# Patient Record
Sex: Male | Born: 1970
Health system: Southern US, Community
[De-identification: ages and names within clinical notes are randomized; demographics above are authoritative.]

## PROBLEM LIST (undated history)

## (undated) DIAGNOSIS — R37 Sexual dysfunction, unspecified: Secondary | ICD-10-CM

## (undated) DIAGNOSIS — M1990 Unspecified osteoarthritis, unspecified site: Secondary | ICD-10-CM

## (undated) DIAGNOSIS — H93A1 Pulsatile tinnitus, right ear: Secondary | ICD-10-CM

## (undated) DIAGNOSIS — A749 Chlamydial infection, unspecified: Secondary | ICD-10-CM

## (undated) DIAGNOSIS — K219 Gastro-esophageal reflux disease without esophagitis: Secondary | ICD-10-CM

## (undated) DIAGNOSIS — M26629 Arthralgia of temporomandibular joint, unspecified side: Secondary | ICD-10-CM

## (undated) DIAGNOSIS — A599 Trichomoniasis, unspecified: Secondary | ICD-10-CM

## (undated) DIAGNOSIS — I10 Essential (primary) hypertension: Secondary | ICD-10-CM

## (undated) DIAGNOSIS — Z8719 Personal history of other diseases of the digestive system: Secondary | ICD-10-CM

## (undated) DIAGNOSIS — S8261XA Displaced fracture of lateral malleolus of right fibula, initial encounter for closed fracture: Secondary | ICD-10-CM

## (undated) DIAGNOSIS — Z8669 Personal history of other diseases of the nervous system and sense organs: Secondary | ICD-10-CM

## (undated) DIAGNOSIS — K828 Other specified diseases of gallbladder: Secondary | ICD-10-CM

## (undated) DIAGNOSIS — L259 Unspecified contact dermatitis, unspecified cause: Secondary | ICD-10-CM

## (undated) DIAGNOSIS — R109 Unspecified abdominal pain: Secondary | ICD-10-CM

## (undated) DIAGNOSIS — K644 Residual hemorrhoidal skin tags: Secondary | ICD-10-CM

## (undated) DIAGNOSIS — F329 Major depressive disorder, single episode, unspecified: Secondary | ICD-10-CM

## (undated) DIAGNOSIS — E559 Vitamin D deficiency, unspecified: Secondary | ICD-10-CM

## (undated) DIAGNOSIS — F29 Unspecified psychosis not due to a substance or known physiological condition: Secondary | ICD-10-CM

## (undated) DIAGNOSIS — E7800 Pure hypercholesterolemia, unspecified: Secondary | ICD-10-CM

## (undated) DIAGNOSIS — R51 Headache: Secondary | ICD-10-CM

## (undated) DIAGNOSIS — F419 Anxiety disorder, unspecified: Secondary | ICD-10-CM

## (undated) DIAGNOSIS — N529 Male erectile dysfunction, unspecified: Secondary | ICD-10-CM

## (undated) DIAGNOSIS — R319 Hematuria, unspecified: Secondary | ICD-10-CM

## (undated) HISTORY — DX: Other specified diseases of gallbladder: K82.8

## (undated) HISTORY — DX: Residual hemorrhoidal skin tags: K64.4

## (undated) HISTORY — DX: Sexual dysfunction, unspecified: R37

## (undated) HISTORY — DX: Vitamin D deficiency, unspecified: E55.9

## (undated) HISTORY — DX: Arthralgia of temporomandibular joint, unspecified side: M26.629

## (undated) HISTORY — DX: Essential (primary) hypertension: I10

## (undated) HISTORY — DX: Unspecified abdominal pain: R10.9

## (undated) HISTORY — DX: Unspecified psychosis not due to a substance or known physiological condition: F29

## (undated) HISTORY — DX: Pure hypercholesterolemia, unspecified: E78.00

## (undated) HISTORY — DX: Trichomoniasis, unspecified: A59.9

## (undated) HISTORY — DX: Male erectile dysfunction, unspecified: N52.9

## (undated) HISTORY — DX: Major depressive disorder, single episode, unspecified: F32.9

## (undated) HISTORY — DX: Hematuria, unspecified: R31.9

## (undated) HISTORY — DX: Anxiety disorder, unspecified: F41.9

## (undated) HISTORY — DX: Chlamydial infection, unspecified: A74.9

## (undated) HISTORY — DX: Personal history of other diseases of the digestive system: Z87.19

## (undated) HISTORY — DX: Unspecified contact dermatitis, unspecified cause: L25.9

## (undated) HISTORY — DX: Displaced fracture of lateral malleolus of right fibula, initial encounter for closed fracture: S82.61XA

---

## 1994-06-02 HISTORY — PX: FEMORAL ARTERY REPAIR: SHX1582

## 1994-06-02 HISTORY — PX: ORIF FEMUR FRACTURE: SHX2119

## 1999-08-08 ENCOUNTER — Emergency Department (HOSPITAL_COMMUNITY): Admission: EM | Admit: 1999-08-08 | Discharge: 1999-08-08 | Payer: Self-pay | Admitting: Emergency Medicine

## 1999-08-08 ENCOUNTER — Encounter: Payer: Self-pay | Admitting: Emergency Medicine

## 2005-07-24 ENCOUNTER — Emergency Department (HOSPITAL_COMMUNITY): Admission: EM | Admit: 2005-07-24 | Discharge: 2005-07-24 | Payer: Self-pay | Admitting: Emergency Medicine

## 2006-06-02 HISTORY — PX: INGUINAL HERNIA REPAIR: SHX194

## 2006-06-02 HISTORY — PX: HERNIA REPAIR: SHX51

## 2006-06-06 ENCOUNTER — Emergency Department (HOSPITAL_COMMUNITY): Admission: EM | Admit: 2006-06-06 | Discharge: 2006-06-06 | Payer: Self-pay | Admitting: Emergency Medicine

## 2006-09-10 ENCOUNTER — Ambulatory Visit (HOSPITAL_COMMUNITY): Admission: RE | Admit: 2006-09-10 | Discharge: 2006-09-10 | Payer: Self-pay | Admitting: Family Medicine

## 2006-09-10 ENCOUNTER — Ambulatory Visit: Payer: Self-pay | Admitting: Family Medicine

## 2006-09-10 ENCOUNTER — Encounter: Payer: Self-pay | Admitting: Family Medicine

## 2006-09-10 DIAGNOSIS — E669 Obesity, unspecified: Secondary | ICD-10-CM | POA: Insufficient documentation

## 2006-09-10 LAB — CONVERTED CEMR LAB
BUN: 14 mg/dL (ref 6–23)
CO2: 22 meq/L (ref 19–32)
Calcium: 9.7 mg/dL (ref 8.4–10.5)
Creatinine, Ser: 0.92 mg/dL (ref 0.40–1.50)
Glucose, Bld: 89 mg/dL (ref 70–99)
Ketones, urine, test strip: NEGATIVE
Urobilinogen, UA: 2
WBC Urine, dipstick: NEGATIVE

## 2006-09-11 ENCOUNTER — Encounter: Payer: Self-pay | Admitting: Family Medicine

## 2006-09-14 ENCOUNTER — Ambulatory Visit: Payer: Self-pay | Admitting: Sports Medicine

## 2006-09-16 ENCOUNTER — Ambulatory Visit: Payer: Self-pay | Admitting: Family Medicine

## 2006-10-15 ENCOUNTER — Ambulatory Visit: Payer: Self-pay | Admitting: Family Medicine

## 2006-11-12 ENCOUNTER — Encounter (INDEPENDENT_AMBULATORY_CARE_PROVIDER_SITE_OTHER): Payer: Self-pay | Admitting: *Deleted

## 2006-12-31 ENCOUNTER — Ambulatory Visit: Payer: Self-pay

## 2006-12-31 ENCOUNTER — Encounter: Payer: Self-pay | Admitting: *Deleted

## 2006-12-31 ENCOUNTER — Telehealth: Payer: Self-pay | Admitting: *Deleted

## 2007-01-01 ENCOUNTER — Encounter (INDEPENDENT_AMBULATORY_CARE_PROVIDER_SITE_OTHER): Payer: Self-pay | Admitting: Family Medicine

## 2007-01-01 LAB — CONVERTED CEMR LAB
CO2: 24 meq/L (ref 19–32)
Glucose, Bld: 76 mg/dL (ref 70–99)
Potassium: 4.4 meq/L (ref 3.5–5.3)
Sodium: 143 meq/L (ref 135–145)

## 2007-01-04 ENCOUNTER — Telehealth (INDEPENDENT_AMBULATORY_CARE_PROVIDER_SITE_OTHER): Payer: Self-pay | Admitting: Family Medicine

## 2007-01-05 ENCOUNTER — Ambulatory Visit (HOSPITAL_COMMUNITY): Admission: RE | Admit: 2007-01-05 | Discharge: 2007-01-05 | Payer: Self-pay | Admitting: Family Medicine

## 2007-01-07 ENCOUNTER — Ambulatory Visit: Payer: Self-pay | Admitting: Family Medicine

## 2007-01-13 ENCOUNTER — Encounter: Payer: Self-pay | Admitting: Family Medicine

## 2007-01-19 ENCOUNTER — Telehealth: Payer: Self-pay | Admitting: Family Medicine

## 2007-02-15 ENCOUNTER — Encounter: Admission: RE | Admit: 2007-02-15 | Discharge: 2007-02-15 | Payer: Self-pay | Admitting: General Surgery

## 2007-02-18 ENCOUNTER — Ambulatory Visit (HOSPITAL_BASED_OUTPATIENT_CLINIC_OR_DEPARTMENT_OTHER): Admission: RE | Admit: 2007-02-18 | Discharge: 2007-02-18 | Payer: Self-pay | Admitting: General Surgery

## 2007-02-25 ENCOUNTER — Ambulatory Visit: Payer: Self-pay | Admitting: Family Medicine

## 2007-09-23 ENCOUNTER — Ambulatory Visit: Payer: Self-pay | Admitting: Family Medicine

## 2007-09-23 DIAGNOSIS — M7120 Synovial cyst of popliteal space [Baker], unspecified knee: Secondary | ICD-10-CM | POA: Insufficient documentation

## 2007-09-24 ENCOUNTER — Encounter: Payer: Self-pay | Admitting: Family Medicine

## 2007-09-24 DIAGNOSIS — M216X9 Other acquired deformities of unspecified foot: Secondary | ICD-10-CM | POA: Insufficient documentation

## 2007-10-07 ENCOUNTER — Encounter: Payer: Self-pay | Admitting: Family Medicine

## 2007-10-21 ENCOUNTER — Encounter: Payer: Self-pay | Admitting: *Deleted

## 2007-12-30 ENCOUNTER — Ambulatory Visit (HOSPITAL_COMMUNITY): Admission: RE | Admit: 2007-12-30 | Discharge: 2007-12-30 | Payer: Self-pay | Admitting: Family Medicine

## 2007-12-30 ENCOUNTER — Ambulatory Visit: Payer: Self-pay | Admitting: Family Medicine

## 2007-12-30 DIAGNOSIS — M6250 Muscle wasting and atrophy, not elsewhere classified, unspecified site: Secondary | ICD-10-CM | POA: Insufficient documentation

## 2008-01-11 ENCOUNTER — Encounter: Payer: Self-pay | Admitting: Family Medicine

## 2008-01-18 ENCOUNTER — Encounter: Payer: Self-pay | Admitting: Family Medicine

## 2008-01-18 ENCOUNTER — Encounter: Admission: RE | Admit: 2008-01-18 | Discharge: 2008-04-17 | Payer: Self-pay | Admitting: Family Medicine

## 2008-01-20 ENCOUNTER — Ambulatory Visit: Payer: Self-pay | Admitting: Family Medicine

## 2008-02-09 ENCOUNTER — Telehealth: Payer: Self-pay | Admitting: Psychology

## 2008-02-17 ENCOUNTER — Ambulatory Visit: Payer: Self-pay | Admitting: Family Medicine

## 2008-02-17 DIAGNOSIS — F4310 Post-traumatic stress disorder, unspecified: Secondary | ICD-10-CM | POA: Insufficient documentation

## 2008-02-23 ENCOUNTER — Encounter: Payer: Self-pay | Admitting: Family Medicine

## 2008-02-24 ENCOUNTER — Ambulatory Visit: Payer: Self-pay | Admitting: Family Medicine

## 2008-03-13 ENCOUNTER — Ambulatory Visit: Payer: Self-pay | Admitting: Family Medicine

## 2008-03-16 ENCOUNTER — Ambulatory Visit: Payer: Self-pay | Admitting: Family Medicine

## 2008-03-20 ENCOUNTER — Ambulatory Visit: Payer: Self-pay | Admitting: Family Medicine

## 2008-04-06 ENCOUNTER — Encounter: Payer: Self-pay | Admitting: *Deleted

## 2008-06-07 ENCOUNTER — Encounter: Payer: Self-pay | Admitting: Family Medicine

## 2008-07-10 ENCOUNTER — Ambulatory Visit: Payer: Self-pay | Admitting: Family Medicine

## 2008-07-24 ENCOUNTER — Ambulatory Visit: Payer: Self-pay | Admitting: Family Medicine

## 2008-07-31 ENCOUNTER — Ambulatory Visit: Payer: Self-pay | Admitting: Family Medicine

## 2008-07-31 LAB — CONVERTED CEMR LAB
BUN: 15 mg/dL (ref 6–23)
CO2: 21 meq/L (ref 19–32)
Chloride: 107 meq/L (ref 96–112)
Creatinine, Ser: 0.82 mg/dL (ref 0.40–1.50)
HDL: 32 mg/dL — ABNORMAL LOW (ref >39)
LDL Cholesterol: 134 mg/dL — ABNORMAL HIGH (ref 0–99)

## 2008-08-02 DIAGNOSIS — F17200 Nicotine dependence, unspecified, uncomplicated: Secondary | ICD-10-CM

## 2008-08-02 DIAGNOSIS — M25569 Pain in unspecified knee: Secondary | ICD-10-CM | POA: Insufficient documentation

## 2008-08-02 DIAGNOSIS — M7061 Trochanteric bursitis, right hip: Secondary | ICD-10-CM | POA: Insufficient documentation

## 2008-08-02 DIAGNOSIS — Z720 Tobacco use: Secondary | ICD-10-CM | POA: Insufficient documentation

## 2008-08-14 ENCOUNTER — Telehealth: Payer: Self-pay | Admitting: Psychology

## 2008-08-28 ENCOUNTER — Ambulatory Visit: Payer: Self-pay | Admitting: Family Medicine

## 2008-09-18 ENCOUNTER — Ambulatory Visit: Payer: Self-pay | Admitting: Psychology

## 2008-09-21 ENCOUNTER — Ambulatory Visit: Payer: Self-pay | Admitting: Family Medicine

## 2008-09-27 ENCOUNTER — Telehealth: Payer: Self-pay | Admitting: Family Medicine

## 2008-10-05 ENCOUNTER — Ambulatory Visit: Payer: Self-pay | Admitting: Family Medicine

## 2008-10-05 DIAGNOSIS — G5790 Unspecified mononeuropathy of unspecified lower limb: Secondary | ICD-10-CM | POA: Insufficient documentation

## 2008-10-11 ENCOUNTER — Telehealth: Payer: Self-pay | Admitting: Psychology

## 2008-10-26 ENCOUNTER — Ambulatory Visit: Payer: Self-pay | Admitting: Psychology

## 2008-10-31 ENCOUNTER — Telehealth: Payer: Self-pay | Admitting: Family Medicine

## 2008-11-02 ENCOUNTER — Ambulatory Visit: Payer: Self-pay | Admitting: Family Medicine

## 2008-11-23 ENCOUNTER — Telehealth: Payer: Self-pay | Admitting: Psychology

## 2008-12-07 ENCOUNTER — Ambulatory Visit: Payer: Self-pay | Admitting: Psychology

## 2008-12-20 ENCOUNTER — Encounter: Payer: Self-pay | Admitting: Psychology

## 2008-12-20 ENCOUNTER — Telehealth: Payer: Self-pay | Admitting: Psychology

## 2008-12-20 ENCOUNTER — Encounter: Payer: Self-pay | Admitting: Family Medicine

## 2008-12-25 ENCOUNTER — Ambulatory Visit: Payer: Self-pay | Admitting: Family Medicine

## 2009-01-19 ENCOUNTER — Encounter: Payer: Self-pay | Admitting: Family Medicine

## 2009-01-23 ENCOUNTER — Telehealth: Payer: Self-pay | Admitting: Family Medicine

## 2009-01-25 ENCOUNTER — Ambulatory Visit: Payer: Self-pay | Admitting: Family Medicine

## 2009-01-29 ENCOUNTER — Telehealth: Payer: Self-pay | Admitting: Psychology

## 2009-02-15 ENCOUNTER — Ambulatory Visit: Payer: Self-pay | Admitting: Family Medicine

## 2009-02-15 ENCOUNTER — Telehealth: Payer: Self-pay | Admitting: Family Medicine

## 2009-03-06 ENCOUNTER — Encounter: Payer: Self-pay | Admitting: Family Medicine

## 2009-03-08 ENCOUNTER — Ambulatory Visit: Payer: Self-pay | Admitting: Family Medicine

## 2009-03-08 DIAGNOSIS — G44209 Tension-type headache, unspecified, not intractable: Secondary | ICD-10-CM

## 2009-03-08 DIAGNOSIS — G43009 Migraine without aura, not intractable, without status migrainosus: Secondary | ICD-10-CM

## 2009-03-08 HISTORY — DX: Migraine without aura, not intractable, without status migrainosus: G43.009

## 2009-03-08 HISTORY — DX: Tension-type headache, unspecified, not intractable: G44.209

## 2009-03-09 ENCOUNTER — Encounter: Payer: Self-pay | Admitting: *Deleted

## 2009-03-12 ENCOUNTER — Encounter: Admission: RE | Admit: 2009-03-12 | Discharge: 2009-03-12 | Payer: Self-pay | Admitting: Family Medicine

## 2009-04-23 ENCOUNTER — Ambulatory Visit: Payer: Self-pay | Admitting: Family Medicine

## 2009-06-07 ENCOUNTER — Ambulatory Visit: Payer: Self-pay | Admitting: Family Medicine

## 2009-08-09 ENCOUNTER — Ambulatory Visit: Payer: Self-pay | Admitting: Family Medicine

## 2009-08-09 DIAGNOSIS — K644 Residual hemorrhoidal skin tags: Secondary | ICD-10-CM | POA: Insufficient documentation

## 2009-08-09 DIAGNOSIS — Z8719 Personal history of other diseases of the digestive system: Secondary | ICD-10-CM | POA: Insufficient documentation

## 2009-08-09 HISTORY — DX: Residual hemorrhoidal skin tags: K64.4

## 2009-08-09 HISTORY — DX: Personal history of other diseases of the digestive system: Z87.19

## 2009-11-08 ENCOUNTER — Ambulatory Visit: Payer: Self-pay | Admitting: Family Medicine

## 2009-11-08 DIAGNOSIS — M545 Low back pain, unspecified: Secondary | ICD-10-CM | POA: Insufficient documentation

## 2010-04-17 ENCOUNTER — Telehealth: Payer: Self-pay | Admitting: Family Medicine

## 2010-06-17 ENCOUNTER — Ambulatory Visit
Admission: RE | Admit: 2010-06-17 | Discharge: 2010-06-17 | Payer: Self-pay | Source: Home / Self Care | Attending: Family Medicine | Admitting: Family Medicine

## 2010-06-18 DIAGNOSIS — F29 Unspecified psychosis not due to a substance or known physiological condition: Secondary | ICD-10-CM | POA: Insufficient documentation

## 2010-06-18 HISTORY — DX: Unspecified psychosis not due to a substance or known physiological condition: F29

## 2010-06-23 ENCOUNTER — Encounter: Payer: Self-pay | Admitting: Family Medicine

## 2010-07-04 ENCOUNTER — Ambulatory Visit: Payer: Self-pay | Admitting: Family Medicine

## 2010-07-04 ENCOUNTER — Encounter: Payer: Self-pay | Admitting: Family Medicine

## 2010-07-04 ENCOUNTER — Ambulatory Visit: Admit: 2010-07-04 | Payer: Self-pay

## 2010-07-04 NOTE — Assessment & Plan Note (Signed)
Summary: F/U VISIT/[facility]   Vital Signs:  Patient profile:   40 year old male Height:      71.5 inches Weight:      208.25 pounds BMI:     28.74 BSA:     2.16 Temp:     98.1 degrees F Pulse rate:   80 / minute BP sitting:   128 / 78  Vitals Entered By: Jone Baseman CMA (June 17, 2010 2:52 PM) CC: Smoking cessation Is Patient Diabetic? No Pain Assessment Patient in pain? no        Primary Care Provider:  Treyveon Mochizuki MD  CC:  Smoking cessation.  History of Present Illness: Smoking Derrick Ortiz is interested in quitting smoking Smoked for last 5 to 6  years. Smokes 3/4 of a pack daily of Newports (Regular) Smokes first cigarette in morning within 10 minutes of awakening. Has not tried to quit in past. Wants to quit because of his children and b/c he cannot smoke at [employer] where he started classes this semester. he does not dip or chew tobacco.  He is now being followed at the [facility] for his Psychosis (NOS)  He has run out of his migraine medications including Naprosyn Imitrex and Inderal LA.  He has notice an increase in his headaches since running out,  he wants to refill and restart these medications.     Habits & Providers  Alcohol-Tobacco-Diet     Alcohol drinks/day: 0     Tobacco Status: current     Tobacco Counseling: to quit use of tobacco products     Cigarette Packs/Day: <0.25     Year Started: 2005     Year Quit: 3 months ago  Current Medications (verified): 1)  Naproxen Dr 500 Mg  Tbec (Naproxen) .Marland Kitchen.. 1 Tablet Twice A Day If Needed For Leg Pain 2)  Kls Omeprazole 20 Mg  Tbec (Omeprazole) .... One Tablet Each Night For Indigestion and Stomach Protection 3)  Seroquel 200 Mg Tabs (Quetiapine Fumarate) .... 2 Tablets in Morning and Two Tablets At Night 4)  Imitrex 100 Mg Tabs (Sumatriptan Succinate) .... One Tablet By Mouth For Severe Headache, May Repeat in Two Hours If Head Ache Persists 5)  Trazodone Hcl 100 Mg Tabs (Trazodone Hcl) .Marland Kitchen.. 1 Tablet  At Bedtime Daily 6)  Inderal La 80 Mg Xr24h-Cap (Propranolol Hcl) .... One Capsule Daily For Prevention of Migraine Headaches 7)  Anusol-Hc 25 Mg Supp (Hydrocortisone Acetate) .... Insert 1 Suppository Into Rectum Three Times A Day Until All Suppositories Are Gone 8)  Nicorette 2 Mg Gum (Nicotine Polacrilex) .... Chew 1 Piece Slowly and Intermittently Overy 30 Minutes Every 1 To 2 Hours For 6 Weeks Then Every 2-4 Hours For 3 Weeks 9)  Abilify 10 Mg Tabs (Aripiprazole) .... One Tablet By Mouth Daily  Allergies (verified): No Known Drug Allergies  Past History:  Past medical, surgical, family and social histories (including risk factors) reviewed for relevance to current acute and chronic problems.  Past Medical History: Reviewed history from 07/31/2008 and no changes required. 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 S/P Femoral-popliteal artery repair (Dr. Nedra Hai)  after traumatic injury in 1996 ORIFfor traumatic  left leg fracture around knee  in 1996 Umbilical Herniorrhaphy [facility] 09/08)  Family History: Reviewed history from 03/13/2008 and no changes required. Family History Diabetes 1st degree relative (Father) Family History Hypertension (  Father) Family History Psychiatric care: Father and paternal grandfather-uncertain of diagnosis or treatments.    Social History: Reviewed history from 08/09/2009 and no changes required. Married to [employer]  in 2008. Patient is functionally Illiterate  Completed 9th grade.  Was in Learning Disorder classes through Middle school.  he has great difficulty with most reading meterials.  (+) Medicaid starting in Fall of 2010  Disabled with SSI income starting 07/2009. Patient lives in his mother's home and visit's his wife's home frequently.  Children: One son  (b. 06/2007) and two step-daughters, Bevelyn Ngo (DOB 08-21-99) and Iran Sizer (07/19/04)-former premie in home. Kavon has a 43 year-old son who does not live in the home but visits often. [facility] clinic patient. contact:Brenda Earl Lites [phone] or [phone]) [facility] Education: >=12 years Sometimes has difficulty understanding written medical information Religian: Christian Current Smoker: 1/5 ppd for last 2 years.  Alcohol use-no Believes he eats a healthy diet Exercise:weights lift. Occupation: Disabled secondary to Psychiatric conditions and left foot palsy.    Impression & Recommendations:  Problem # 1:  CIGARETTE SMOKER (ICD-305.1) Assessment Comment Only  After discussion of nicotene replacement options, [name] chose the use of nicotene gum replacement.   He was given the [facility] QUIT NOW telephone number and sked to contact them for coaching him during his quit attempt. He plans to set his quit date for this week.  RTC in 2 weeks to assess success and tolerance to medication.  His updated medication list for this problem includes:    Nicorette 2 Mg Gum (Nicotine polacrilex) .Marland Kitchen... Chew 1 piece slowly and intermittently overy 30 minutes every 1 to 2 hours for 6 weeks then every 2-4 hours for 3 weeks  Orders: [facility]- Est Level  3 (19147)  Complete Medication List: 1)  Naproxen Dr 500 Mg Tbec (Naproxen) .Marland Kitchen.. 1 tablet twice a day if needed for leg pain 2)  Kls Omeprazole 20 Mg Tbec (Omeprazole) .... One tablet each night for indigestion and stomach protection 3)  Seroquel 200 Mg Tabs (Quetiapine fumarate) .... 2 tablets in morning and two tablets at night 4)  Imitrex 100 Mg Tabs (Sumatriptan succinate) .... One tablet by mouth for severe headache, may repeat in two hours if head ache persists 5)  Trazodone Hcl 100 Mg Tabs (Trazodone hcl) .Marland Kitchen.. 1 tablet at bedtime daily 6)  Inderal La 80 Mg Xr24h-cap (Propranolol hcl) .... One capsule daily for prevention  of migraine headaches 7)  Anusol-hc 25 Mg Supp (Hydrocortisone acetate) .... Insert 1 suppository into rectum three times a day until all suppositories are gone 8)  Nicorette 2 Mg Gum (Nicotine polacrilex) .... Chew 1 piece slowly and intermittently overy 30 minutes every 1 to 2 hours for 6 weeks then every 2-4 hours for 3 weeks 9)  Abilify 10 Mg Tabs (Aripiprazole) .... One tablet by mouth daily  Patient Instructions: 1)  Please schedule a follow-up appointment in 2 weeks-may double book.  2)  Call [facility] QUIT-NOW ([phone]) to get helpful counseling on quiting smoking.  3)  Chew the Nicorette gun until it starts to burn, then tuck the gum down into the space between your ckeck and gum to absorb the nicotene.  Chew on the gum to reactivate it when you have another cigareete craving, then tuck the gum back into your gum again. 4)  Once the gum stops burning, replace it with a fresh piece of gum.   Prescriptions: INDERAL LA 80 MG XR24H-CAP (PROPRANOLOL HCL) One  capsule daily for prevention of migraine headaches  #34 x PRN   Entered and Authorized by:   Tawanna Cooler Wiktoria Hemrick MD   Signed by:   Tawanna Cooler Aubryanna Nesheim MD on 06/17/2010   Method used:   Electronically to        [id number]* (retail)       [address]       [place], [place]  [address]       Ph: [phone]       Fax: [phone]   RxID:   [id number] IMITREX 100 MG TABS (SUMATRIPTAN SUCCINATE) One tablet by mouth for severe headache, may repeat in two hours if head ache persists  #12 x PRN   Entered and Authorized by:   Tawanna Cooler Abrey Bradway MD   Signed by:   Tawanna Cooler Cochise Dinneen MD on 06/17/2010   Method used:   Electronically to        [facility] [phone]* (retail)       [address]       [place], [place]  [address]       Ph: [phone]       Fax: [phone]   RxID:   [id number] KLS OMEPRAZOLE 20 MG  TBEC (OMEPRAZOLE) One tablet each night for indigestion and stomach protection  #30 x 5   Entered and  Authorized by:   Tawanna Cooler Eulonda Andalon MD   Signed by:   Tawanna Cooler Ezekeil Bethel MD on 06/17/2010   Method used:   Electronically to        [id number]* (retail)       [address]       [place], [place]  [address]       Ph: [phone]       Fax: [phone]   RxID:   [id number] NAPROXEN DR 500 MG  TBEC (NAPROXEN) 1 tablet twice a day if needed for leg pain  #60 x PRN   Entered and Authorized by:   Tawanna Cooler Kenai Fluegel MD   Signed by:   Tawanna Cooler Alixis Harmon MD on 06/17/2010   Method used:   Electronically to        [facility] [phone]* (retail)       [address]       [place], [place]  [address]       Ph: [phone]       Fax: [phone]   RxID:   [id number] NICORETTE 2 MG GUM (NICOTINE POLACRILEX) Chew 1 piece slowly and intermittently overy 30 minutes every 1 to 2 hours for 6 weeks then every 2-4 hours for 3 weeks  #108 x 2   Entered and Authorized by:   Tawanna Cooler Virda Betters MD   Signed by:   Tawanna Cooler Shirelle Tootle MD on 06/17/2010   Method used:   Electronically to        [facility] [phone]* (retail)       [address]       [place], [place]  [address]       Ph: [phone]       Fax: [phone]   RxID:   [id number]    Orders Added: 1)  [facility]- Est Level  3 [90240]

## 2010-07-04 NOTE — Assessment & Plan Note (Signed)
Summary: back pain/eo   Vital Signs:  Patient profile:   40 year old male Height:      71.5 inches Weight:      203 pounds BMI:     28.02 BSA:     2.14 Temp:     97.6 degrees F Pulse rate:   75 / minute BP sitting:   118 / 81  Vitals Entered By: Jone Baseman CMA (November 08, 2009 9:19 AM) CC: f/u Is Patient Diabetic? No Pain Assessment Patient in pain? yes     Location: back Intensity: 8   Primary Care Provider:  Acquanetta Belling MD  CC:  f/u.  History of Present Illness: BACK PAIN Location: bilateral low back Onset: 5-6 months ago Description: aching, tight pain that has been associated with his "back locking up on me" three times in last 6 months.  Better with:  Pulling knees to chest. Having someone walk on his low back. lying down or standing.  Naprosyn and Ibuprofen not helping  Worse with: sitting or wearing left leg prosthesis Trauma: none recalled  Red Flags Fecal/urinary incontinence: none Weakness: none Fever/chills: none Night pain: none Unexplained weight loss: none No relief with bedrest: no Cancer/immunosuppression: no IV drug use: no PMH of osteoporosis or chronic steroid use: no     Habits & Providers  Alcohol-Tobacco-Diet     Alcohol drinks/day: 0     Tobacco Status: current     Tobacco Counseling: to quit use of tobacco products     Cigarette Packs/Day: <0.25  Current Medications (verified): 1)  Naproxen Dr 500 Mg  Tbec (Naproxen) .Marland Kitchen.. 1 Tablet Twice A Day If Needed For Leg Pain 2)  Kls Omeprazole 20 Mg  Tbec (Omeprazole) .... Two Tablets Each Night For Indigestion and Stomach Protection 3)  Seroquel 200 Mg Tabs (Quetiapine Fumarate) .... 2 Tablets in Morning and Two Tablets At Night 4)  Imitrex 100 Mg Tabs (Sumatriptan Succinate) .... One Tablet By Mouth For Severe Headache, May Repeat in Two Hours If Head Ache Persists 5)  Trazodone Hcl 100 Mg Tabs (Trazodone Hcl) .Marland Kitchen.. 1 Tablet At Bedtime Daily 6)  Inderal La 80 Mg Xr24h-Cap  (Propranolol Hcl) .... One Capsule Daily For Prevention of Migraine Headaches 7)  Anusol-Hc 25 Mg Supp (Hydrocortisone Acetate) .... Insert 1 Suppository Into Rectum Three Times A Day Until All Suppositories Are Gone  Allergies (verified): No Known Drug Allergies  Past History:  Past medical history reviewed for relevance to current acute and chronic problems. Past surgical history reviewed for relevance to current acute and chronic problems.  Past Medical History: Reviewed history from 07/31/2008 and no changes required. 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 S/P Femoral-popliteal artery repair (Dr. Nedra Hai)  after traumatic injury in 1996 ORIFfor traumatic  left leg fracture around knee  in 1996 Umbilical Herniorrhaphy [facility] 09/08)  Physical Exam  General:  alert, well-developed, and well-nourished.   Neurologic:  strength normal in all extremities and DTRs symmetrical and normal.   Psych:  normally interactive, good eye contact, not anxious appearing, and not depressed appearing.     Detailed Back/Spine Exam  General:    No significant lumbar  lordosis  Gait:    non-antalgic.    Skin:    no erythema  Inspection:    no deformity or swelling  Lumbosacral Exam:  Inspection-deformity:    Normal  Palpation-spinal tenderness:  Normal Range of Motion:    Forward Flexion:   25 degrees    Hyperextension:   15 degrees    Right Lateral Bend:   25 degrees    Left Lateral Bend:   25 degrees Lying Straight Leg Raise:    Right:  negative    Left:  negative Sitting Straight Leg Raise:    Right:  negative    Left:  negative Contralateral Straight Leg Raise:    Right:  negative    Left:  negative Sciatic Notch:    There is no sciatic notch tenderness. Toe Walking:    Right:  normal    Left:   normal Heel Walking:    Right:  normal    Left:  normal Patrick's Maneuver:    Right:  negative    Left:  negative Fabere Test:    Right:  negative    Left:  negative   Impression & Recommendations:  Problem # 1:  LOW BACK PAIN, CHRONIC (ICD-724.2) Assessment New Non-specific back pain.  No findings concerning for serious spinal pathology.  Given chronicity, will check Lumbar spine  Referral for Physical Therapy for evaluation and treatment Pt Ed material on back care and back exercises given.  His updated medication list for this problem includes:    Naproxen Dr 500 Mg Tbec (Naproxen) .Marland Kitchen... 1 tablet twice a day if needed for leg pain  Orders: Radiology other (Radiology Other) Physical Therapy Referral (PT) [facility]- Est Level  3 (93235)  Complete Medication List: 1)  Naproxen Dr 500 Mg Tbec (Naproxen) .Marland Kitchen.. 1 tablet twice a day if needed for leg pain 2)  Kls Omeprazole 20 Mg Tbec (Omeprazole) .... Two tablets each night for indigestion and stomach protection 3)  Seroquel 200 Mg Tabs (Quetiapine fumarate) .... 2 tablets in morning and two tablets at night 4)  Imitrex 100 Mg Tabs (Sumatriptan succinate) .... One tablet by mouth for severe headache, may repeat in two hours if head ache persists 5)  Trazodone Hcl 100 Mg Tabs (Trazodone hcl) .Marland Kitchen.. 1 tablet at bedtime daily 6)  Inderal La 80 Mg Xr24h-cap (Propranolol hcl) .... One capsule daily for prevention of migraine headaches 7)  Anusol-hc 25 Mg Supp (Hydrocortisone acetate) .... Insert 1 suppository into rectum three times a day until all suppositories are gone

## 2010-07-04 NOTE — Progress Notes (Signed)
  Phone Note Call from Patient   Caller: Patient Call For: [phone] Summary of Call: Patient calling regarding meds IMETRIX not working for headaches.  Need another type of meds called to pharmacy.  [facility] [address] Initial call taken by: Abundio Miu,  April 17, 2010 3:56 PM  Follow-up for Phone Call        Patient may use OTC aleve or tylenol for headaches.  He should make an appointment to see me to discuss his headaches.  Follow-up by: Tawanna Cooler McDiarmid MD,  April 22, 2010 11:15 AM     Appended Document:  Pt informed and agreeable

## 2010-07-04 NOTE — Assessment & Plan Note (Signed)
Summary: f/u,df   Vital Signs:  Patient profile:   40 year old male Height:      71.5 inches Weight:      210 pounds BMI:     28.99 BSA:     2.17 Temp:     98.7 degrees F Pulse rate:   81 / minute BP sitting:   121 / 83  Vitals Entered By: Jone Baseman CMA (August 09, 2009 10:21 AM) CC: F/U Multiple issues Is Patient Diabetic? No Pain Assessment Patient in pain? no        Primary Care Provider:  TODD MCDIARMID MD  CC:  F/U Multiple issues.  History of Present Illness: pain and bump around anus Onset last week. Has been straining at stool Stools have been hard. BM about once a day.  Hx of hemorrhoids while incarcerated, treated with an ointment. No blood in stool. No abdominal pain.   ENCOUNTERS OTHER SPEC ADMINISTRATIVE PURPOSE OTH (ICD-V68.89)    Patient was assigned Disabled status with SSI last month.   AUDITORY HALLUCINATION (ICD-780.1) & PTSD (ICD-309.81)  Well controlled on Seroquel. To be seen at [facility] in next 1-2 weeks.    MIGRAINE WITHOUT AURA (ICD-346.10)     Headaches occur less than once every two weeks now, and respond well to [facility].  Tolerating Imitrex without chest pain, dizziness, anxiety.  CIGARETTE SMOKER (ICD-305.1)  Smoking 3-5 cigarettes a day, Newports.     Habits & Providers  Alcohol-Tobacco-Diet     Alcohol drinks/day: 0     Tobacco Status: current     Tobacco Counseling: to quit use of tobacco products     Cigarette Packs/Day: <0.25  Exercise-Depression-Behavior     Have you felt down or hopeless? no     Have you felt little pleasure in things? no     Depression Counseling: not indicated; screening negative for depression     Drug Use: past  Current Medications (verified): 1)  Naproxen Dr 500 Mg  Tbec (Naproxen) .Marland Kitchen.. 1 Tablet Twice A Day If Needed For Leg Pain 2)  Kls Omeprazole 20 Mg  Tbec (Omeprazole) .... Two Tablets Each Night For Indigestion and Stomach Protection 3)  Seroquel 200 Mg Tabs (Quetiapine  Fumarate) .... 2 Tablets in Morning and Two Tablets At Night 4)  Imitrex 100 Mg Tabs (Sumatriptan Succinate) .... One Tablet By Mouth For Severe Headache, May Repeat in Two Hours If Head Ache Persists 5)  Trazodone Hcl 100 Mg Tabs (Trazodone Hcl) .Marland Kitchen.. 1 Tablet At Bedtime Daily 6)  Inderal La 80 Mg Xr24h-Cap (Propranolol Hcl) .... One Capsule Daily For Prevention of Migraine Headaches 7)  Anusol-Hc 25 Mg Supp (Hydrocortisone Acetate) .... Insert 1 Suppository Into Rectum Three Times A Day Until All Suppositories Are Gone  Allergies (verified): No Known Drug Allergies  Past History:  Past medical history reviewed for relevance to current acute and chronic problems.  Past Medical History: Reviewed history from 07/31/2008 and no changes required. 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 Patient is functionally Illiterate  Completed 9th grade.  Was in Learning Disorder classes through Middle school.  he has great difficulty with most reading meterials.  (+) Medicaid starting in Fall of 2010  Disabled with SSI income starting 07/2009. Patient lives in his mother's home and visit's his wife's home frequently.  Children: One son (b. 06/2007) and two step-daughters, Bevelyn Ngo (DOB 08-21-99)  and [employer] (07/19/04)-former premie in home. Rahmel has a 2 year-old son who does not live in the home but visits often. [facility] clinic patient. contact:Brenda Earl Lites [phone] or [phone]) Education: >=12 years Sometimes has difficulty understanding written medical information Religian: Christian Current Smoker: 1/5 ppd for last 2 years.  Alcohol use-no Believes he eats a healthy diet Exercise:weights lift. Occupation: Disabled secondary to Psychiatric conditions and left foot palsy.  Occupation:  employed Drug Use:  past Packs/Day:   <0.25  Physical Exam  General:  NAD, Vital signs noted. Groomed, cooperative.  Abdomen:  soft, non-tender, normal bowel sounds, and no distention.   Rectal:  Tender 5mm bluish nodule palpated just at anal verge at 7 O'clock position. no external abnormalities, normal sphincter tone, and no masses.   no blood on DRE finger.  Psych:  memory intact for recent and remote, normally interactive, good eye contact, not anxious appearing, and not depressed appearing.     Impression & Recommendations:  Problem # 1:  HEMORRHOIDS, EXTERNAL (ICD-455.3)  Thrombosed external hemorrhoid, small.  Will treat with Anusol HC suppository, 1 PR three times a day for 7-8 days. Increase dietary fiber to soften stool to prevent recurrence. Notify Dr Perley Jain if not improving by end of week of suppository therapy.  Orders: [facility]- Est Level  3 (75643)  Problem # 2:  MIGRAINE WITHOUT AURA (ICD-346.10) Assessment: Improved  Responding well to prophylactic beta-blocker therapy.  Using abortive therapy of Imitrex of Naproxen less than twice a month now.  tolerating prophylactic and abortive therapy.  continue current regiment.  His updated medication list for this problem includes:    Naproxen Dr 500 Mg Tbec (Naproxen) .Marland Kitchen... 1 tablet twice a day if needed for leg pain    Imitrex 100 Mg Tabs (Sumatriptan succinate) ..... One tablet by mouth for severe headache, may repeat in two hours if head ache persists    Inderal La 80 Mg Xr24h-cap (Propranolol hcl) ..... One capsule daily for prevention of migraine headaches  Orders: [facility]- Est Level  3 (32951)  Problem # 3:  AUDITORY HALLUCINATION (ICD-780.1) Assessment: Comment Only Stable.  Scheduled  Follow up with [facility] in 1-2 weeks.  They are prescribing his Seroquel and Trazodone.  No weight gian.  BP good.   Problem # 4:  CIGARETTE SMOKER (ICD-305.1) Assessment: Comment Only Not interested in quitting for now though he is contemplative..  Doubt  significant nicotene dependence.  he would likely do well with behavioral interventions and nicotene gum for replacement if and when he decides to quit  Complete Medication List: 1)  Naproxen Dr 500 Mg Tbec (Naproxen) .Marland Kitchen.. 1 tablet twice a day if needed for leg pain 2)  Kls Omeprazole 20 Mg Tbec (Omeprazole) .... Two tablets each night for indigestion and stomach protection 3)  Seroquel 200 Mg Tabs (Quetiapine fumarate) .... 2 tablets in morning and two tablets at night 4)  Imitrex 100 Mg Tabs (Sumatriptan succinate) .... One tablet by mouth for severe headache, may repeat in two hours if head ache persists 5)  Trazodone Hcl 100 Mg Tabs (Trazodone hcl) .Marland Kitchen.. 1 tablet at bedtime daily 6)  Inderal La 80 Mg Xr24h-cap (Propranolol hcl) .... One capsule daily for prevention of migraine headaches 7)  Anusol-hc 25 Mg Supp (Hydrocortisone acetate) .... Insert 1 suppository into rectum three times a day until all suppositories are gone  Patient Instructions: 1)  Please schedule a follow-up appointment in 3 months .  2)  Dr  McDiarmid believes that you do have a hemorroid. 3)  Use the Anusol suppository, insert one suppository into your rectum three times a day until all the suppositories are gone.  This medication should help reduce the pain and itching from the Hemorrhoids.  4)  Keep your stool soft using greens and vegetables. The stool should fall apart when it hits the toilet water.   5)  Avoid straining when you have a bowel movement.  Straining causes hemorrhoids.  Prescriptions: INDERAL LA 80 MG XR24H-CAP (PROPRANOLOL HCL) One capsule daily for prevention of migraine headaches  #34 x 11   Entered and Authorized by:   Tawanna Cooler McDiarmid MD   Signed by:   Tawanna Cooler McDiarmid MD on 08/09/2009   Method used:   Electronically to        [facility] #[id number]* (retail)       [address]       [place], [place]  [address]       Ph: [phone]       Fax: [phone]   RxID:   [id number] IMITREX 100  MG TABS (SUMATRIPTAN SUCCINATE) One tablet by mouth for severe headache, may repeat in two hours if head ache persists  #12 x 5   Entered and Authorized by:   Tawanna Cooler McDiarmid MD   Signed by:   Tawanna Cooler McDiarmid MD on 08/09/2009   Method used:   Electronically to        [facility] #[id number]* (retail)       [address]       [place], [place]  [address]       Ph: [phone]       Fax: [phone]   RxID:   [id number] ANUSOL-HC 25 MG SUPP (HYDROCORTISONE ACETATE) Insert 1 suppository into rectum three times a day until all suppositories are gone  #24 supposito x 1   Entered and Authorized by:   Tawanna Cooler McDiarmid MD   Signed by:   Tawanna Cooler McDiarmid MD on 08/09/2009   Method used:   Electronically to        [facility] #[id number]* (retail)       [address]       [place], [place]  [address]       Ph: [phone]       Fax: [phone]   RxID:   [id number] NAPROXEN DR 500 MG  TBEC (NAPROXEN) 1 tablet twice a day if needed for leg pain  #60 x 11   Entered and Authorized by:   Tawanna Cooler McDiarmid MD   Signed by:   Tawanna Cooler McDiarmid MD on 08/09/2009   Method used:   Electronically to        [facility] [phone]* (retail)       [address]       [place], [place]  [address]       Ph: [phone]       Fax: [phone]   RxID:   [id number]

## 2010-07-04 NOTE — Assessment & Plan Note (Signed)
Summary: place on bottom of foot/stop smoking,tcb   Vital Signs:  Patient profile:   40 year old male Height:      71.5 inches Weight:      211 pounds BMI:     29.12 BSA:     2.17 Temp:     98.3 degrees F Pulse rate:   79 / minute BP sitting:   127 / 83  Vitals Entered By: Jone Baseman CMA (June 07, 2009 9:13 AM) CC: fell in snow and hurt back Is Patient Diabetic? No Pain Assessment Patient in pain? yes     Location: back Intensity: 7   Primary Care Provider:  Javanni Maring MD  CC:  fell in snow and hurt back.  History of Present Illness: Needs letter written for Medicaid application to take to court hearing for his disability application.   BACK PAIN Location: right lower back Onset: acute, after fall onto back last week while shoveling snow. Description: aching Modifying factors: rest  Symptoms Worse with: walking Better with: rest  Red Flags Fecal/urinary incontinence: none Weakness: none Fever/chills: none Night pain: none Unexplained weight loss: none Relief with bedrest: Yes Cancer/immunosuppression:no  IV drug use: no PMH of osteoporosis or chronic steroid use: no     Habits & Providers  Alcohol-Tobacco-Diet     Alcohol drinks/day: 0     Tobacco Status: current     Tobacco Counseling: to quit use of tobacco products     Cigarette Packs/Day: 0.5  Current Medications (verified): 1)  Naproxen Dr 500 Mg  Tbec (Naproxen) .Marland Kitchen.. 1 Tablet Twice A Day If Needed For Leg Pain 2)  Kls Omeprazole 20 Mg  Tbec (Omeprazole) .... Two Tablets Each Night For Indigestion and Stomach Protection 3)  Seroquel 200 Mg Tabs (Quetiapine Fumarate) .... 2 Tablets in Morning and Two Tablets At Night 4)  Imitrex 100 Mg Tabs (Sumatriptan Succinate) .... One Tablet By Mouth For Severe Headache, May Repeat in Two Hours If Head Ache Persists 5)  Trazodone Hcl 100 Mg Tabs (Trazodone Hcl) .Marland Kitchen.. 1 Tablet At Bedtime Daily 6)  Inderal La 80 Mg Xr24h-Cap (Propranolol Hcl)  .... One Capsule Daily For Prevention of Migraine Headaches  Allergies: No Known Drug Allergies  Past History:  Past medical, surgical, family and social histories (including risk factors) reviewed for relevance to current acute and chronic problems.  Past Medical History: Reviewed history from 07/31/2008 and no changes required. 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 S/P Femoral-popliteal artery repair (Dr. Nedra Hai)  after traumatic injury in 1996 ORIFfor traumatic  left leg fracture around knee  in 1996 Umbilical Herniorrhaphy [facility] 09/08)  Family History: Reviewed history from 03/13/2008 and no changes required. Family History Diabetes 1st degree relative (Father) Family History Hypertension (Father) Family History Psychiatric care: Father and paternal grandfather-uncertain of diagnosis or treatments.    Social History: Reviewed history from 04/23/2009 and no changes required. Married to [employer]  in 2008. Patient is functionally Illiterate  Completed 9th grade.  Was in Learning Disorder classes through Middle school.  he has great difficulty with most reading meterials.  (+) Medicaid starting in Fall of 2010   Patient lives in his mother's home and visit's his wife's home frequently.   Children: One son (b. 06/2007) and two step-daughters, Bevelyn Ngo (DOB 08-21-99) and [employer] (07/19/04)-former premie in home. Briyan has a 14  year-old son who does not live in the home but visits often.  Currently unemployed Former worked as Nature conservation officer at [employer].  [employer] [facility] member [facility] clinic patient. contact:Brenda Earl Lites [phone] or [phone]) Education: >=12 years Sometimes has difficulty understanding written medical information Religian:  Christian  Current Smoker: 1/5 ppd for last 2 years.  Alcohol use-no Believes he eats a healthy diet Exercise:weights lift. Packs/Day:  0.5  Physical Exam  General:  Well-developed,well-nourished,in no acute distress; alert,appropriate and cooperative throughout examination Psych:  normally interactive, good eye contact, not anxious appearing, and not depressed appearing.     Detailed Back/Spine Exam  Gait:    non-antalgic gait Able to get up on exam table without difficulty.    Inspection:    No deformity:, no ecchymosis:, nor swelling over lumbar back    Lumbosacral Exam:  Inspection-deformity:    Normal Palpation-spinal tenderness:  Normal Range of Motion:    Forward Flexion:   25 degrees    Hyperextension:   25 degrees    Right Lateral Bend:   25 degrees    Left Lateral Bend:   25 degrees Lying Straight Leg Raise:    Right:  negative    Left:  negative Sciatic Notch:    There is right sciatic notch tenderness.   Impression & Recommendations:  Problem # 1:  KNEE PAIN, LEFT, CHRONIC (ICD-719.46) Nonspecific back pain without radiculopathy, acute, mild trauma (fall from standing position).  NSAID, relative rest, ice or heat, gentle ROM, NSAID as needed Red Flags for RTC reviewed.  Pt ed material on Low Back Pain His updated medication list for this problem includes:    Naproxen Dr 500 Mg Tbec (Naproxen) .Marland Kitchen... 1 tablet twice a day if needed for leg pain  Problem # 2:  ENCOUNTERS OTHER SPEC ADMINISTRATIVE PURPOSE OTH (ICD-V68.89) Copy of Letter from 12/20/08 describing his medical and psychiatric conditions with handwritten addendum on letter stating that the description of Mr Mccaskill's conditions are still appropriate.    Complete Medication List: 1)  Naproxen Dr 500 Mg Tbec (Naproxen) .Marland Kitchen.. 1 tablet twice a day if needed for leg pain 2)  Kls Omeprazole 20 Mg Tbec (Omeprazole) .... Two tablets each night for indigestion and stomach protection 3)  Seroquel 200 Mg Tabs  (Quetiapine fumarate) .... 2 tablets in morning and two tablets at night 4)  Imitrex 100 Mg Tabs (Sumatriptan succinate) .... One tablet by mouth for severe headache, may repeat in two hours if head ache persists 5)  Trazodone Hcl 100 Mg Tabs (Trazodone hcl) .Marland Kitchen.. 1 tablet at bedtime daily 6)  Inderal La 80 Mg Xr24h-cap (Propranolol hcl) .... One capsule daily for prevention of migraine headaches  Other Orders: [facility]- Est Level  3 ([id number])  Patient Instructions: 1)  Please schedule a follow-up appointment in 3 months .  2)  Prescriptions for Imitrex for your migraine headaches, Inderal for your blood pressure and headaches, and trazodone to help you sleep were sent to the [facility] pharmacy on [address].  3)  Switch your wallet to your left pocket or front pockets. 4)  Good luck with your Disability hearing this Monday! Prescriptions: INDERAL LA 80 MG XR24H-CAP (PROPRANOLOL HCL) One capsule daily for prevention of migraine headaches  #34 x 5   Entered and Authorized by:   Tawanna Cooler Bernadine Melecio MD   Signed by:   Tawanna Cooler Zafiro Routson MD on 06/07/2009   Method used:   Electronically to        [facility]  [address] [phone]* (retail)       [address]       [place], [place]  [address]       Ph: [phone]       Fax: [phone]   RxID:   [id number] TRAZODONE HCL 100 MG TABS (TRAZODONE HCL) 1 tablet at bedtime daily  #30 x 5   Entered and Authorized by:   Tawanna Cooler Juleon Narang MD   Signed by:   Tawanna Cooler Terrell Shimko MD on 06/07/2009   Method used:   Electronically to        [facility] #[id number]* (retail)       [address]       [place], [place]  [address]       Ph: [phone]       Fax: [phone]   RxID:   [id number] IMITREX 100 MG TABS (SUMATRIPTAN SUCCINATE) One tablet by mouth for severe headache, may repeat in two hours if head ache persists  #10 x 5   Entered and Authorized by:   Tawanna Cooler Kylani Wires MD   Signed by:   Tawanna Cooler Alnita Aybar MD on 06/07/2009   Method used:   Electronically to         [facility] [phone]* (retail)       [address]       [place], [place]  [address]       Ph: [phone]       Fax: [phone]   RxID:   [id number] TRAZODONE HCL 100 MG TABS (TRAZODONE HCL) 1 tablet at bedtime daily  #30 x 5   Entered and Authorized by:   Tawanna Cooler Shyenne Maggard MD   Signed by:   Tawanna Cooler Winslow Ederer MD on 06/07/2009   Method used:   Electronically to        [facility] [phone]* (retail)       [address]       [place], [place]  [address]       Ph: [phone]       Fax: [phone]   RxID:   [id number] INDERAL LA 80 MG XR24H-CAP (PROPRANOLOL HCL) One capsule daily for prevention of migraine headaches  #34 x 5   Entered and Authorized by:   Tawanna Cooler Curtez Brallier MD   Signed by:   Tawanna Cooler Myrtle Haller MD on 06/07/2009   Method used:   Electronically to        [facility] #[id number]* (retail)       [address]       [place], [place]  [address]       Ph: [phone]       Fax: [phone]   RxID:   [id number] IMITREX 100 MG TABS (SUMATRIPTAN SUCCINATE) One tablet by mouth for severe headache, may repeat in two hours if head ache persists  #10 x 5   Entered and Authorized by:   Tawanna Cooler Hammad Finkler MD   Signed by:   Tawanna Cooler Kaisy Severino MD on 06/07/2009   Method used:   Electronically to        [facility] [phone]* (retail)       [address]       [place], [place]  [address]       Ph: [phone]       Fax: [phone]   RxID:   [id number]

## 2010-10-07 ENCOUNTER — Ambulatory Visit: Payer: Self-pay | Admitting: Family Medicine

## 2010-10-15 NOTE — Op Note (Signed)
Derrick Ortiz, Derrick Ortiz               ACCOUNT NO.:  [id number]   MEDICAL RECORD NO.:  [id number]          PATIENT TYPE:  AMB   LOCATION:  [facility]                          FACILITY:  [facility]   PHYSICIAN:  Cherylynn Ridges, M.D.    DATE OF BIRTH:  [birth year]   DATE OF PROCEDURE:  02/18/2007  DATE OF DISCHARGE:                               OPERATIVE REPORT   PREOPERATIVE DIAGNOSIS:  Supraumbilical ventral hernia.   POSTOPERATIVE DIAGNOSIS:  Supraumbilical ventral hernia and an umbilical  hernia.   PROCEDURE:  Repair of supraumbilical and umbilical ventral hernias with  6 x 4 cm piece of polypropylene mesh.   SURGEON:  Marta Lamas. Lindie Spruce, M.D.   ANESTHESIA:  General endotracheal.   ESTIMATED BLOOD LOSS:  Less than 20 mL.   COMPLICATIONS:  None.   CONDITION:  Stable.   No specimens were sent.   INDICATIONS FOR OPERATION:  The patient is a 40 year old with a de novo  supraumbilical ventral hernia who comes in for repair.   FINDINGS:  As mentioned, the patient had a supraumbilical ventral hernia  which measured approximately 5 x 3 cm in size.  He also had a small 2 cm  umbilical hernia which was repaired in continuity with the  supraumbilical ventral hernia.   OPERATION:  The patient was taken to the operating room, placed on the  table in the supine position.  after an adequate endotracheal anesthetic  was administered, he was prepped and draped in the usual sterile manner  exposing the midline of the abdomen.   A midline incision was made from approximately 5 to 6 cm above the  umbilicus down to the umbilicus and just to the right of it.  It was  taken down into the subcutaneous tissue using electrocautery.  We could  easily palpate the hernia sac just above the umbilicus and we dissected  down to its fascial connections.  At that point, we dissected out the  edges of the hernia sac, then resected it, detaching it from the omentum  which was attached within.   This left a 5 x 3 cm  defect, which we repaired primarily using  interrupted simple stitches of #1 Novofil.  We dissected out the edge of  fascia in which to attached the polypropylene mesh which measured 6 x 4  cm in size and had been soaked in antibiotic solution.  We tacked it  down with 10 stitches of #1 Novofil circumferentially and then figure-of-  eight stitches across the midline.  Once the mesh was in place, we  irrigated with antibiotic solution again and then closed with a deep  layer of 3-0 Vicryl and more superficial subcuticular stitch of 3-0  Vicryl.  We injected 0.5% Marcaine without epinephrine into the wound, a  total of 20 mL used.  We then closed the skin using a running  subcuticular stitch of 4-0 Monocryl.  Dermabond was used to occlude the  skin, then Steri-Strips applied along with a Tegaderm dressing.   Needle counts, sponge counts and instrument counts were correct.      Cherylynn Ridges,  M.D.  Electronically Signed     JOW/MEDQ  D:  02/18/2007  T:  02/18/2007  Job:  [id number]

## 2010-11-21 ENCOUNTER — Ambulatory Visit (INDEPENDENT_AMBULATORY_CARE_PROVIDER_SITE_OTHER): Payer: Medicaid Other | Admitting: Family Medicine

## 2010-11-21 ENCOUNTER — Encounter: Payer: Self-pay | Admitting: Family Medicine

## 2010-11-21 DIAGNOSIS — F17200 Nicotine dependence, unspecified, uncomplicated: Secondary | ICD-10-CM

## 2010-11-21 DIAGNOSIS — K219 Gastro-esophageal reflux disease without esophagitis: Secondary | ICD-10-CM

## 2010-11-21 DIAGNOSIS — M25569 Pain in unspecified knee: Secondary | ICD-10-CM

## 2010-11-21 DIAGNOSIS — R443 Hallucinations, unspecified: Secondary | ICD-10-CM

## 2010-11-21 DIAGNOSIS — F29 Unspecified psychosis not due to a substance or known physiological condition: Secondary | ICD-10-CM

## 2010-11-21 DIAGNOSIS — G43009 Migraine without aura, not intractable, without status migrainosus: Secondary | ICD-10-CM

## 2010-11-21 MED ORDER — SUMATRIPTAN SUCCINATE 100 MG PO TABS: 100.0000 mg | ORAL_TABLET | ORAL | Status: DC | PRN

## 2010-11-21 MED ORDER — NAPROXEN 500 MG PO TABS: 500.0000 mg | ORAL_TABLET | Freq: Two times a day (BID) | ORAL | Status: DC

## 2010-11-21 MED ORDER — ARIPIPRAZOLE 10 MG PO TABS: 10.0000 mg | ORAL_TABLET | Freq: Every day | ORAL | Status: DC

## 2010-11-21 MED ORDER — SUMATRIPTAN SUCCINATE 100 MG PO TABS: ORAL_TABLET | ORAL | Status: DC

## 2010-11-21 MED ORDER — OMEPRAZOLE 20 MG PO CPDR: 20.0000 mg | DELAYED_RELEASE_CAPSULE | Freq: Every day | ORAL | Status: DC

## 2010-11-21 MED ORDER — PROPRANOLOL HCL ER 80 MG PO CP24: 80.0000 mg | ORAL_CAPSULE | Freq: Every day | ORAL | Status: DC

## 2010-11-21 NOTE — Assessment & Plan Note (Signed)
Mr Derrick Ortiz's auditory hallucinations have been louder, more negative and paranoid in content, and disturbing to him.  He would like to restart his antipsychotic medications.   My last record of Mr Derrick Ortiz's antipsychotic regiment has him on both Seroquel and Abilify.  I am not certain this is correct, and Derrick Ortiz was not certain either. Will restart just Abilify 10 mg daily with close follow up in 2 weeks.  Derrick Ortiz is to contact the [facility] to set up an appointment to re-establish psychiatric care with them, including his psychotropic medication management.

## 2010-11-21 NOTE — Assessment & Plan Note (Signed)
Restarted patients prophylactic Inderal and abortive tx, Imitrex.

## 2010-11-21 NOTE — Assessment & Plan Note (Signed)
Refill patient's as needed Naproxen Rx.

## 2010-11-21 NOTE — Progress Notes (Signed)
  Subjective:    Patient ID: Derrick Ortiz, male    DOB: Aug 31, 1970, 40 y.o.   MRN: [id number]  HPI Psychosis/auditory Hallucinations Derrick Ortiz has been without his psychotropic and other medications for the last 1-2 months. He stopped taking his medications after he was pulled from school at [facility] b/c the school did not recognize his GED he earned while incarcerated. The voices in his head have become louder, negative and somewhat paranoid.  The voices told him that the [facility] had worked with [facility] to remove him from school.  He realizes that this is not true but still has not gone back to the [facility] for several months.    He felt he was being successful in school having earned a "B' grade and praise from his teachers.  He felt this was the first time he had succeed in school and had looked forward to each school day.  He felt devastated when he was removed from school and has been sad, upset, and more withdrawn since this happended.  He still feels supported by his wife and family.l  He has no thought of harming himself.  He thinks he is starting to come out of his sadness in the last week, and slowly building up his motivation to returning to school to get another GED.    His migraine headaches have been more frequent since he was forced to leave school. He reports taking his last Imitrex tablet this AM for a bad headache.  He has no headache currently.   Medications, past medical history,  family history, social history were reviewed and updated.  Review of Systems  Psychiatric/Behavioral: Positive for hallucinations, sleep disturbance and decreased concentration. Negative for confusion, self-injury and agitation. The patient is not hyperactive.    See HPI     Objective:   Physical Exam  Constitutional: Vital signs are normal. He appears well-developed and well-nourished. He is cooperative.  Non-toxic appearance. He does not have a sickly appearance. He does not appear ill. No  distress.  Eyes: Conjunctivae are normal. Pupils are equal, round, and reactive to light.  Neurological: He is alert.       Normal Gait.  No nystagmus.   Psychiatric: His behavior is normal. Judgment normal. His speech is not rapid and/or pressured, not delayed, not tangential and not slurred. He is not agitated, not aggressive, is not hyperactive, not slowed, not withdrawn and not actively hallucinating. Thought content is paranoid (not paranoid currently but relays paranoid thoughts in recent past that he recognizes as not true. ). Thought content is not delusional. Cognition and memory are normal. Cognition and memory are not impaired. He does not express inappropriate judgment. He exhibits a depressed mood (mildly sad affect but brightens up during converstation ). He expresses no homicidal and no suicidal ideation. He expresses no suicidal plans and no homicidal plans. He exhibits normal recent memory. He is attentive.          Assessment & Plan:

## 2010-11-21 NOTE — Patient Instructions (Signed)
Call the [facility] to set up an appointment   Restarting your Abilify to help stop the Voices Restarting the Naprosyn for your knee pain Restarting your Imitrex (sumatriptan) for your migraine headaches Restarting your omeprazole for your indigestion and upset stomach.

## 2010-11-21 NOTE — Assessment & Plan Note (Signed)
Derrick Ortiz has restarted smoking since being pulled from school.  Will re-address issue when pschiatric issues are more stable.

## 2010-12-05 ENCOUNTER — Ambulatory Visit: Payer: Self-pay | Admitting: Family Medicine

## 2011-03-06 ENCOUNTER — Encounter: Payer: Self-pay | Admitting: Family Medicine

## 2011-03-06 ENCOUNTER — Ambulatory Visit (INDEPENDENT_AMBULATORY_CARE_PROVIDER_SITE_OTHER): Payer: Medicaid Other | Admitting: Family Medicine

## 2011-03-06 VITALS — BP 153/83 | HR 60 | Temp 97.4°F | Ht 71.5 in | Wt 210.2 lb

## 2011-03-06 DIAGNOSIS — M79641 Pain in right hand: Secondary | ICD-10-CM | POA: Insufficient documentation

## 2011-03-06 DIAGNOSIS — E669 Obesity, unspecified: Secondary | ICD-10-CM

## 2011-03-06 DIAGNOSIS — M79609 Pain in unspecified limb: Secondary | ICD-10-CM

## 2011-03-06 LAB — URIC ACID: Uric Acid, Serum: 4.1 mg/dL (ref 4.0–7.8)

## 2011-03-06 LAB — BASIC METABOLIC PANEL
Chloride: 105 mEq/L (ref 96–112)
Creat: 0.96 mg/dL (ref 0.50–1.35)

## 2011-03-06 MED ORDER — COLCHICINE 0.6 MG PO TABS: 0.6000 mg | ORAL_TABLET | Freq: Two times a day (BID) | ORAL | Status: DC

## 2011-03-06 NOTE — Progress Notes (Signed)
  Subjective:    Patient ID: Derrick Ortiz, male    DOB: 11/03/1970, 40 y.o.   MRN: [id number]  HPI 40 year old male coming in with right hand pain. Patient states that for approximately 2 weeks was very swollen and very red initially for the first 4 days. Patient denies any type of fevers, chills, nausea, vomiting, abdominal pain, or any weakness or numbness in the hand. Patient has not had anything like this before. Patient does not remember any type of injury trauma or bites to the area. Patient states that he does have a strong family history of gout and is concerned that this could be potentially a flare. Patient states over the course of time it has seemed to be improving but still has a lot of pain and is unable to move his ring and middle finger and full flexion or extension .   Review of Systems As above    Objective:   Physical Exam General: No apparent distress healthy [place] male Patient's right hand: Patient does have some swelling compared to contralateral side patient does have full range of motion of the elbow as well as the rest patient though does have decrease flexion of fingers as well as full extension has some mild swelling of the dorsal aspect of the hand more on the alert side. Patient is only minimally tender to palpation but does cause pain with flexion of the fingers. Bilateral 5/5 strength neurovascularly intact    Assessment & Plan:

## 2011-03-06 NOTE — Assessment & Plan Note (Signed)
Patient's inpatient discussed with Dr. Mauricio Po at this time differential diagnosis includes scalp. Most likely but still consideration is tenosynovitis as well as possible infection. Patient though not showing signs of a systemic problems such as fever will treat as gout with colchicine twice a day dosing Y. patient on Monday to make a doing better not the patient is not doing better at that time would consider treatment of cellulitis patient given red flags to look out for and when to seek medical attention.  Patient has no family history of autoimmune diseases but would keep in the differential

## 2011-03-06 NOTE — Patient Instructions (Signed)
Patient given verbal instructions 

## 2011-03-13 LAB — DIFFERENTIAL
Basophils Absolute: 0
Basophils Relative: 1
Eosinophils Absolute: 0.1
Eosinophils Relative: 1
Neutrophils Relative %: 40 — ABNORMAL LOW

## 2011-03-13 LAB — COMPREHENSIVE METABOLIC PANEL
ALT: 31
Alkaline Phosphatase: 79
CO2: 30
Calcium: 9.9
Chloride: 107
GFR calc non Af Amer: >60
Glucose, Bld: 114 — ABNORMAL HIGH
Potassium: 4.1
Sodium: 140
Total Bilirubin: 1.1

## 2011-03-13 LAB — CBC
Hemoglobin: 15.9
MCHC: 34.1
RBC: 5.24
WBC: 5.1

## 2011-05-05 ENCOUNTER — Encounter: Payer: Medicaid Other | Admitting: Family Medicine

## 2011-06-12 ENCOUNTER — Ambulatory Visit (INDEPENDENT_AMBULATORY_CARE_PROVIDER_SITE_OTHER): Payer: Medicaid Other | Admitting: Family Medicine

## 2011-06-12 ENCOUNTER — Encounter: Payer: Self-pay | Admitting: Family Medicine

## 2011-06-12 DIAGNOSIS — R443 Hallucinations, unspecified: Secondary | ICD-10-CM

## 2011-06-12 DIAGNOSIS — R030 Elevated blood-pressure reading, without diagnosis of hypertension: Secondary | ICD-10-CM

## 2011-06-12 DIAGNOSIS — F29 Unspecified psychosis not due to a substance or known physiological condition: Secondary | ICD-10-CM

## 2011-06-12 DIAGNOSIS — G43009 Migraine without aura, not intractable, without status migrainosus: Secondary | ICD-10-CM

## 2011-06-12 DIAGNOSIS — M25569 Pain in unspecified knee: Secondary | ICD-10-CM

## 2011-06-12 DIAGNOSIS — F17200 Nicotine dependence, unspecified, uncomplicated: Secondary | ICD-10-CM

## 2011-06-12 DIAGNOSIS — K219 Gastro-esophageal reflux disease without esophagitis: Secondary | ICD-10-CM

## 2011-06-12 DIAGNOSIS — IMO0001 Reserved for inherently not codable concepts without codable children: Secondary | ICD-10-CM

## 2011-06-12 MED ORDER — PROPRANOLOL HCL ER 80 MG PO CP24: 80.0000 mg | ORAL_CAPSULE | Freq: Every day | ORAL | Status: DC

## 2011-06-12 MED ORDER — ARIPIPRAZOLE 10 MG PO TABS
10.0000 mg | ORAL_TABLET | Freq: Every day | ORAL | Status: DC
Start: 2011-06-12 — End: 2013-03-21

## 2011-06-12 MED ORDER — OMEPRAZOLE 20 MG PO CPDR: 20.0000 mg | DELAYED_RELEASE_CAPSULE | Freq: Every day | ORAL | Status: DC

## 2011-06-12 MED ORDER — SUMATRIPTAN SUCCINATE 100 MG PO TABS: ORAL_TABLET | ORAL | Status: DC

## 2011-06-12 MED ORDER — NAPROXEN 500 MG PO TABS: 500.0000 mg | ORAL_TABLET | Freq: Two times a day (BID) | ORAL | Status: DC

## 2011-06-12 NOTE — Assessment & Plan Note (Signed)
Restared patient's prophylactice Inderal and abortive therapy, Initrex.

## 2011-06-12 NOTE — Assessment & Plan Note (Signed)
Not interested in smoking cessation at this time 
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
## 2011-06-12 NOTE — Patient Instructions (Addendum)
Your blood pressure is up a little.  I would like to see how your blood pressure is after you restart your medications.  Restart you Seroquel, Sumatriptan, Propranolol, Omeprazole, and Naprosyn.   Substitutes fruit for cookies and sweets to see if you can get your weight down about 5 pounds.   Your weight today was 215 pounds.

## 2011-06-12 NOTE — Assessment & Plan Note (Signed)
New finding for Derrick Ortiz.  Will get patient back on his chronic medications, which includes Inderal LA for migraine prophylaxis, then see him back in couple weeks to recheck BP.  encourgaed wieght loss of 5 pounds through decreasing sweets and snacks intake.
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
## 2011-07-07 ENCOUNTER — Ambulatory Visit: Payer: Medicaid Other | Admitting: Family Medicine

## 2011-07-10 ENCOUNTER — Encounter: Payer: Self-pay | Admitting: Family Medicine

## 2011-07-29 ENCOUNTER — Ambulatory Visit: Payer: Medicaid Other

## 2011-08-18 ENCOUNTER — Encounter: Payer: Self-pay | Admitting: Family Medicine

## 2011-08-18 ENCOUNTER — Ambulatory Visit (INDEPENDENT_AMBULATORY_CARE_PROVIDER_SITE_OTHER): Payer: Medicaid Other | Admitting: Family Medicine

## 2011-08-18 VITALS — BP 138/95 | HR 90 | Ht 71.5 in | Wt 214.6 lb

## 2011-08-18 DIAGNOSIS — N529 Male erectile dysfunction, unspecified: Secondary | ICD-10-CM

## 2011-08-18 NOTE — Progress Notes (Signed)
Subjective: The patient is a 41 y.o. year old male who presents today for "male problems."  Pt presents here today with his wife of six years due to sexual dysfunction.  They report that wife has been on medication for her mood for several years and has had a very limited sex-drive.  This has been improving recently and has brought problems to light.  When they attempt to engage in intercourse he has a hard time obtaining an erection that is hard enough for penetration.  He also seems to have some problems with premature ejaculation.  Pt is no longer taking any medications and reports morning erections.  He masturbates for 10-15 min 2-3 times per day and has done so for the past several years.  He regularly watches pornography.  He does not have any known PVD and denies any claudication.  He does continue to smoke.  Objective:  Filed Vitals:   08/18/11 1114  BP: 138/95  Pulse: 90   Gen: NAD, overweight Ext: 2+ pulses, no neuropathy  Assessment/Plan:  Please also see individual problems in problem list for problem-specific plans.

## 2011-08-25 DIAGNOSIS — N529 Male erectile dysfunction, unspecified: Secondary | ICD-10-CM | POA: Insufficient documentation

## 2011-08-25 HISTORY — DX: Male erectile dysfunction, unspecified: N52.9

## 2011-08-25 NOTE — Assessment & Plan Note (Signed)
The patient does experience spontaneous erections and reports them as being fairly hard.  As such, I am less inclined to think this is organic in cause, especially with no other signs of PVD.  I have recommended that the patient stop masturbating and substitute having intercourse with his wife.  I have also recommended cessation of viewing pornographic images on a regular basis and have recommended seeing a marriage councilor to address underlying issues.  If patient addresses these issues and is still having problems might consider treatment with ED medications.

## 2012-10-29 ENCOUNTER — Encounter: Payer: Self-pay | Admitting: Family Medicine

## 2012-10-29 ENCOUNTER — Ambulatory Visit (INDEPENDENT_AMBULATORY_CARE_PROVIDER_SITE_OTHER): Payer: Medicaid Other | Admitting: Family Medicine

## 2012-10-29 VITALS — BP 138/83 | HR 83 | Temp 98.1°F | Ht 71.5 in | Wt 220.0 lb

## 2012-10-29 DIAGNOSIS — G44209 Tension-type headache, unspecified, not intractable: Secondary | ICD-10-CM

## 2012-10-29 DIAGNOSIS — R51 Headache: Secondary | ICD-10-CM

## 2012-10-29 MED ORDER — IBUPROFEN 200 MG PO TABS
600.0000 mg | ORAL_TABLET | Freq: Once | ORAL | Status: AC
  Administered 2012-10-29: 600 mg via ORAL

## 2012-10-29 MED ORDER — PROMETHAZINE HCL 12.5 MG PO TABS
25.0000 mg | ORAL_TABLET | Freq: Once | ORAL | Status: AC
  Administered 2012-10-29: 25 mg via ORAL

## 2012-10-29 NOTE — Progress Notes (Signed)
  Subjective:    Patient ID: Derrick Ortiz, male    DOB: 1971/04/05, 42 y.o.   MRN: [id number]  HPI  [name] comes in for headaches that have started happening every day.  He says he had migrane headaches but now they are happening every day, he is not sleeping well.  He says the headache is in the middle of his head, not significantly different from previous besides the frequency.  He is taking naproxen, aleve, imitrex nearly every day, without relief.  He has some nausea, which is not new.  Is taking propranolol.   Review of Systems See HPI    Objective:   Physical Exam BP 138/83  Pulse 83  Temp(Src) 98.1 F (36.7 C) (Oral)  Ht 5' 11.5" (1.816 m)  Wt 220 lb (99.791 kg)  BMI 30.26 kg/m2 General appearance: alert, cooperative and no distress Neuro: CN II-XII in tact, normal strength, sensation, and reflexes.       Assessment & Plan:

## 2012-10-29 NOTE — Assessment & Plan Note (Signed)
Likely now with medication overuse headache.  Will give PO migraine cocktail today (pt refusing IM medications) and have him stop all triptans and NSAIDS.  Advised to go home and sleep, f/u with McDiarmid in a few weeks.

## 2012-10-29 NOTE — Patient Instructions (Signed)
It was nice to meet you.  Please stop taking Naproxen, Aleve, ibuprofen, and imitrex for your headaches.  Please go home and try to sleep today.   Please make an appointment to see Dr. McDiarmid in 1-2 weeks to make sure you are doing better.

## 2012-11-05 ENCOUNTER — Telehealth: Payer: Self-pay | Admitting: Family Medicine

## 2012-12-20 ENCOUNTER — Encounter: Payer: Self-pay | Admitting: Family Medicine

## 2012-12-20 ENCOUNTER — Ambulatory Visit (INDEPENDENT_AMBULATORY_CARE_PROVIDER_SITE_OTHER): Payer: Medicaid Other | Admitting: Family Medicine

## 2012-12-20 VITALS — BP 120/78 | HR 93 | Temp 98.1°F | Ht 71.5 in | Wt 220.0 lb

## 2012-12-20 DIAGNOSIS — R319 Hematuria, unspecified: Secondary | ICD-10-CM

## 2012-12-20 DIAGNOSIS — R350 Frequency of micturition: Secondary | ICD-10-CM

## 2012-12-20 DIAGNOSIS — R109 Unspecified abdominal pain: Secondary | ICD-10-CM | POA: Insufficient documentation

## 2012-12-20 LAB — BASIC METABOLIC PANEL
Chloride: 104 mEq/L (ref 96–112)
Potassium: 3.8 mEq/L (ref 3.5–5.3)
Sodium: 137 mEq/L (ref 135–145)

## 2012-12-20 LAB — POCT UA - MICROSCOPIC ONLY

## 2012-12-20 LAB — POCT URINALYSIS DIPSTICK
Bilirubin, UA: NEGATIVE
Glucose, UA: NEGATIVE
Leukocytes, UA: NEGATIVE
Nitrite, UA: NEGATIVE
Urobilinogen, UA: 2

## 2012-12-20 MED ORDER — HYDROCODONE-ACETAMINOPHEN 10-325 MG PO TABS: 1.0000 | ORAL_TABLET | Freq: Three times a day (TID) | ORAL | Status: DC | PRN

## 2012-12-20 NOTE — Progress Notes (Signed)
Patient ID: Derrick Ortiz, male   DOB: [birth year], 42 y.o.   MRN: [id number]  [facility] Domique Reardon M. Sufian Ravi, MD Phone: [phone]   Subjective: HPI: Patient is a 42 y.o. male presenting to clinic today for same day appointment for "kidney problem".  Urinary Tract Infection Patient complains of right flank pain and urinary frequency. He has had symptoms for 2 days. Patient also complains of back pain and subjective fevers, although none recorded. Patient denies headache and stomach ache. Patient does not have a history of recurrent UTI. Patient does not have a history of pyelonephritis. No dysuria, but does report more dark yellow appearance to urine.   Flank pain has improved some today, but continues to feel "sore". Tried aleve which did not help. He says that bending forward makes pain worse. No penis discharge, no groin pain. Father has history of kidney stones, but patient has not had one in the past.  History Reviewed: Some day smoker. Health Maintenance: UTD  ROS: Please see HPI above.  Objective: Office vital signs reviewed. Ht 5' 11.5" (1.816 m)  Wt 220 lb (99.791 kg)  BMI 30.26 kg/m2  Physical Examination:  General: Awake, alert. Non-septic appearing but appears uncomfortable. HEENT: Atraumatic, normocephalic. MMM Pulm: CTAB, no wheezes Cardio: RRR, no murmurs appreciated Abdomen:+BS, soft, nontender, nondistended Back: Mild CVA tenderness but not exquisite pain Neuro: Grossly intact  Results for orders placed in visit on 12/20/12 (from the past 24 hour(s))  POCT URINALYSIS DIPSTICK     Status: Abnormal   Collection Time    12/20/12  1:47 PM      Result Value Range   Color, UA yellow     Clarity, UA clear     Glucose, UA negative     Bilirubin, UA negative     Ketones, UA trace     Spec Grav, UA >=1.030     Blood, UA moderate     pH, UA 6.5     Protein, UA 30     Urobilinogen, UA 2.0     Nitrite, UA negative     Leukocytes, UA  Negative      Assessment: 42 y.o. male with flank pain and hematuria  Plan: See Problem List and After Visit Summary

## 2012-12-20 NOTE — Assessment & Plan Note (Signed)
With urinary frequency and hematuria. UA not suggestive of UTI, but will send for culture. I am most concerned about a kidney stone given his pain and blood in urine. Will check BMet for renal function, give a strainer for his urine and order a non-contrast CT per renal protocol to eval for calculi. Patient encouraged to drink plenty of water and also given Norco for pain. WIll call with any abnormal results. F/u in 1 week or sooner if he fails to improve. Patient agrees with pain.

## 2012-12-20 NOTE — Patient Instructions (Signed)
It was nice to meet you today. I am sorry you do not feel well.  I will call you with any abnormal findings.   Drink plenty of water, and rest as much as you can.  Jabin Tapp M. Arlyn Bumpus, M.D.

## 2012-12-20 NOTE — Addendum Note (Signed)
Addended by: [place], Angeleah Labrake on: 12/20/2012 03:42 PM   Modules accepted: Orders

## 2012-12-21 LAB — URINE CULTURE
Colony Count: NO GROWTH
Organism ID, Bacteria: NO GROWTH

## 2012-12-23 ENCOUNTER — Telehealth: Payer: Self-pay | Admitting: Family Medicine

## 2012-12-23 ENCOUNTER — Ambulatory Visit (HOSPITAL_COMMUNITY)
Admission: RE | Admit: 2012-12-23 | Discharge: 2012-12-23 | Disposition: A | Discharge status: Home / Self Care | Payer: Medicaid Other | Source: Ambulatory Visit | Attending: Family Medicine | Admitting: Family Medicine

## 2012-12-23 ENCOUNTER — Encounter (HOSPITAL_COMMUNITY): Payer: Self-pay

## 2012-12-23 DIAGNOSIS — I708 Atherosclerosis of other arteries: Secondary | ICD-10-CM | POA: Insufficient documentation

## 2012-12-23 DIAGNOSIS — R319 Hematuria, unspecified: Secondary | ICD-10-CM

## 2012-12-23 DIAGNOSIS — R109 Unspecified abdominal pain: Secondary | ICD-10-CM

## 2012-12-23 DIAGNOSIS — R350 Frequency of micturition: Secondary | ICD-10-CM

## 2012-12-23 DIAGNOSIS — K828 Other specified diseases of gallbladder: Secondary | ICD-10-CM | POA: Insufficient documentation

## 2012-12-23 NOTE — Telephone Encounter (Signed)
Pt returned Dr. Algis Downs call and would like to have the ultrasound done. He would like [place] to call him when it is scheduled and where to go. JW

## 2012-12-23 NOTE — Telephone Encounter (Signed)
Called Derrick Ortiz about results of his CT scan. Patient is unavailable at this time, so left message with wife for him to call our clinic to get results.  His CT scan does NOT show anything in his kidney - kidneys are normal size and no kidney stones. However, it does show some non-specific abnormality of his gallbladder. (No gallstones, but does show some fluid in the gallbladder.) The CT scan is not very good at showing the details of the gallbladder, so I would recommend getting an ultrasound to look at the gallbladder a little closer. This could definitely be the cause of his pain. This could be nothing at all, but I would feel better if we can look at it with an ultrasound.  If patient agrees, can we please order RUQ ultrasound?  Overall, I hope he is feeling better and I will be available tomorrow if he has any questions.  Maclovia Uher M. Marguerite Barba, M.D.

## 2012-12-24 NOTE — Telephone Encounter (Signed)
[place] ordered, but needs to be scheduled. Please advise patient of date of exam.  He should probably come back to clinic for a follow up if he is still having pain as well.  Thanks! Alexah Kivett M. Teige Rountree, M.D.

## 2012-12-24 NOTE — Telephone Encounter (Signed)
Left a message for pt that appt for abdominal US was made for Monday 12/27/12 @8am .  Pt is to arrive at 7:45am nothing by mouth after midnight.  Park at [facility] and it is radiology on the first floor.  Informed pt that he could call back if there were any questions.  Jazmin Hartsell,CMA

## 2012-12-24 NOTE — Telephone Encounter (Signed)
PT notified of u/s on 12/27/2012 at 8 am.  Radene Ou, CMA

## 2012-12-27 ENCOUNTER — Ambulatory Visit (HOSPITAL_COMMUNITY)
Admission: RE | Admit: 2012-12-27 | Discharge: 2012-12-27 | Disposition: A | Discharge status: Home / Self Care | Payer: Medicaid Other | Source: Ambulatory Visit | Attending: Family Medicine | Admitting: Family Medicine

## 2012-12-27 DIAGNOSIS — K828 Other specified diseases of gallbladder: Secondary | ICD-10-CM | POA: Insufficient documentation

## 2012-12-27 DIAGNOSIS — R109 Unspecified abdominal pain: Secondary | ICD-10-CM

## 2012-12-28 ENCOUNTER — Telehealth: Payer: Self-pay | Admitting: Family Medicine

## 2012-12-28 DIAGNOSIS — K828 Other specified diseases of gallbladder: Secondary | ICD-10-CM

## 2012-12-28 NOTE — Telephone Encounter (Signed)
Patient is calling to find out the results of his CT Scan and Ultrasound.

## 2012-12-28 NOTE — Telephone Encounter (Signed)
I called Mr. Gillen to let him know that his ultrasound showed an enlarged gallbladder with sludge but without evidence of a stone or any obstruction. He reports he is continuing to have intermittent pain. I think it is best for him to see surgery to discuss possible cholecystectomy. Patient agrees with plan and was very appreciative of phone call.  Zamarion Longest M. Quinisha Mould, M.D. 12/28/2012 4:25 PM

## 2012-12-29 NOTE — Telephone Encounter (Signed)
Called and LMOVM of new pt coordinator info about referral.  Will await callback. Myran Arcia, Maryjo [place]

## 2013-01-04 ENCOUNTER — Ambulatory Visit (INDEPENDENT_AMBULATORY_CARE_PROVIDER_SITE_OTHER): Payer: Medicaid Other | Admitting: General Surgery

## 2013-01-04 ENCOUNTER — Encounter (INDEPENDENT_AMBULATORY_CARE_PROVIDER_SITE_OTHER): Payer: Self-pay | Admitting: General Surgery

## 2013-01-04 VITALS — BP 126/84 | HR 88 | Temp 97.3°F | Ht 71.0 in | Wt 220.4 lb

## 2013-01-04 DIAGNOSIS — R109 Unspecified abdominal pain: Secondary | ICD-10-CM | POA: Insufficient documentation

## 2013-01-04 LAB — HEPATIC FUNCTION PANEL
ALT: 19 U/L (ref 0–53)
Albumin: 4.3 g/dL (ref 3.5–5.2)
Total Protein: 6.8 g/dL (ref 6.0–8.3)

## 2013-01-04 NOTE — Patient Instructions (Signed)
Take  Ibuprofen or aleve for 2 weeks with food.  (voltaren gel is OK)  Use heating pad 2-3 x/day.  Be careful to avoid burns.  Do not use pad directly on skin.  Follow up as needed.

## 2013-01-04 NOTE — Progress Notes (Signed)
Chief Complaint  Patient presents with  . New Evaluation    eval gallbladder    HISTORY: Pt is a 42 yo M who presents with several months of Right flank pain/abdominal pain.  He states that this came on after he helped someone move.  He is very strong, and he lifts a lot.  He states that the pain comes and goes, and is associated with muscle spasms.  It is so severe that it stops him in his tracks.  It is worse with a lot of activity.  It is not associated with nausea/vomiting, or food.  He also feels some referral of the pain to his right leg/groin.  He denies fever/ chills.    Past Medical History  Diagnosis Date  . Hypertension     Past Surgical History  Procedure Laterality Date  . Femoral artery repair  1996    S/P Femoral-popliteal artery repair (Dr. Nedra Hai)  after traumatic injury in 1996  . Orif femur fracture  1996  . Inguinal hernia repair  2008    [facility]  . Hernia repair  2008    ventral hernia repair    Current Outpatient Prescriptions  Medication Sig Dispense Refill  . ARIPiprazole (ABILIFY) 10 MG tablet Take 1 tablet (10 mg total) by mouth daily.  30 tablet  1  . HYDROcodone-acetaminophen (NORCO) 10-325 MG per tablet Take 1 tablet by mouth every 8 (eight) hours as needed for pain.  20 tablet  0  . naproxen (NAPROSYN) 500 MG tablet Take 1 tablet (500 mg total) by mouth 2 (two) times daily with a meal. For leg pain.  60 tablet  5  . omeprazole (PRILOSEC) 20 MG capsule Take 1 capsule (20 mg total) by mouth at bedtime. For indigestion and stomach problems.  30 capsule  5  . propranolol (INDERAL LA) 80 MG 24 hr capsule Take 1 capsule (80 mg total) by mouth daily. For prevention of migraine headaches.  30 capsule  5  . SUMAtriptan (IMITREX) 100 MG tablet One tablet by mouth for severe headache, may repeat in 2 hours if headache persists.  9 tablet  5   No current facility-administered medications for this visit.     No Known Allergies   Family History   Problem Relation Age of Onset  . Diabetes type II Father   . Hypertension Father      History   Social History  . Marital Status: Married    Spouse Name: N/A    Number of Children: N/A  . Years of Education: N/A   Occupational History  . Disabled      secondary to psychiatric conditions & left foot drop   Social History Main Topics  . Smoking status: Current Every Day Smoker -- 0.25 packs/day  . Smokeless tobacco: Never Used     Comment: smokes approximately 2 cigarettes per week  . Alcohol Use: No  . Drug Use: No  . Sexually Active: Yes -- Male partner(s)   Other Topics Concern  . None   Social History Narrative   Married to [employer]  in 2008.   Patient is functionally Illiterate    Completed 9th grade.  Was in Learning Disorder classes through Middle school.  he has great difficulty with most reading meterials.    (+) Medicaid starting in Fall of 2010    Disabled with SSI income starting 07/2009.   Patient lives in his mother's home and visit's his wife's home frequently.  Children: One son (b. 06/2007) and two step-daughters, Bevelyn Ngo (DOB 08-21-99) and Iran Sizer (07/19/04)-former premie in home. Isaul has a 28 year-old son who does not live in the home but visits often.   [facility] patient. contact:Brenda Earl Lites [phone] or [phone])   [facility] patient for psychosis (NOS)   Education: >=12 years   Religian: Christian   Current Smoker: 1/5 ppd for last 2 years.    Alcohol use-no   Believes he eats a healthy diet   Exercise:weights lift.   Occupation: Disabled secondary to Psychiatric conditions and left foot palsy.            REVIEW OF SYSTEMS - PERTINENT POSITIVES ONLY: 12 point review of systems negative other than HPI and PMH except for abdominal/flank pain.    EXAMCeasar Mons Vitals:   01/04/13 0939  BP: 126/84  Pulse: 88  Temp: 97.3 F (36.3 C)   Filed Weights   01/04/13 0939  Weight: 220 lb  6.4 oz (99.973 kg)    Gen:  No acute distress.  Well nourished and well groomed.   Neurological: Alert and oriented to person, place, and time. Coordination normal.  Head: Normocephalic and atraumatic.  Eyes: Conjunctivae are normal. Pupils are equal, round, and reactive to light. No scleral icterus.  Neck: Normal range of motion. Neck supple. No tracheal deviation or thyromegaly present.  Cardiovascular: Normal rate, regular rhythm, normal heart sounds and intact distal pulses.  Exam reveals no gallop and no friction rub.  No murmur heard. Respiratory: Effort normal.  No respiratory distress. No chest wall tenderness. Breath sounds normal.  No wheezes, rales or rhonchi.  GI: Soft. Bowel sounds are normal. The abdomen is soft and nontender.  There is no rebound and no guarding.  Musculoskeletal: Normal range of motion. Extremities are nontender. no point tenderness on back or costal margin.  No evidence of lumbar hernia.   Lymphadenopathy: No cervical, preauricular, postauricular or axillary adenopathy is present Skin: Skin is warm and dry. No rash noted. No diaphoresis. No erythema. No pallor. No clubbing, cyanosis, or edema.   Psychiatric: Normal mood and affect. Behavior is normal. Judgment and thought content normal.    LABORATORY RESULTS: Available labs are reviewed  No LFTs avail.  BMET normal.  U/A with some blood  RADIOLOGY RESULTS: See E-Chart or I-Site for most recent results.  Images and reports are reviewed. ultrasound IMPRESSION:  Gallbladder distension is noted and gallbladder lumen is filled  with echogenic material which most likely represents tumefactive  sludge. Minimal pericholecystic fluid is noted; if the patient is  symptomatic in this area, some degree of cholecystitis may be  considered. CT IMPRESSION:  1. High attenuation material in a distended gallbladder and cystic  duct. Cannot exclude hemorrhage or tumefactive sludge. Recommend  ultrasound correlation.   2. No renal or obstructing ureteral calculi.     ASSESSMENT AND PLAN: Right flank pain Pt does not appear to have gallbladder related pain.  It seems that this is musculoskeletal in origin based on the fact that it comes on with activity and is relieved by rest.   He may have something like a hernia in the lumbar triangle, but I cannot feel one on my exam, and there is not one seen on CT scan.  I advised him to use heating pad, take NSAIDS, and to follow up with PCP.  I advised him that he certainly is at risk to develop gallbladder infection, but he would be more  likely to develop symptoms including nausea or vomiting with the pain, and he would likely note association with food.   He should return if those symptoms develop.  I have ordered LFTS.         Maudry Diego MD Surgical Oncology, General and Endocrine Surgery [facility]      Visit Diagnoses: 1. Right flank pain     Primary Care Physician: Etta Grandchild, MD

## 2013-01-04 NOTE — Assessment & Plan Note (Addendum)
Pt does not appear to have gallbladder related pain.  It seems that this is musculoskeletal in origin based on the fact that it comes on with activity and is relieved by rest.   He may have something like a hernia in the lumbar triangle, but I cannot feel one on my exam, and there is not one seen on CT scan.  I advised him to use heating pad, take NSAIDS, and to follow up with PCP.  I advised him that he certainly is at risk to develop gallbladder infection, but he would be more likely to develop symptoms including nausea or vomiting with the pain, and he would likely note association with food.   He should return if those symptoms develop.  I have ordered LFTS.

## 2013-01-17 ENCOUNTER — Telehealth (INDEPENDENT_AMBULATORY_CARE_PROVIDER_SITE_OTHER): Payer: Self-pay

## 2013-01-17 ENCOUNTER — Encounter: Payer: Self-pay | Admitting: Family Medicine

## 2013-01-17 ENCOUNTER — Ambulatory Visit: Payer: Medicaid Other | Admitting: Family Medicine

## 2013-01-17 ENCOUNTER — Ambulatory Visit (INDEPENDENT_AMBULATORY_CARE_PROVIDER_SITE_OTHER): Payer: Medicaid Other | Admitting: Family Medicine

## 2013-01-17 DIAGNOSIS — R319 Hematuria, unspecified: Secondary | ICD-10-CM | POA: Insufficient documentation

## 2013-01-17 DIAGNOSIS — R109 Unspecified abdominal pain: Secondary | ICD-10-CM

## 2013-01-17 HISTORY — DX: Hematuria, unspecified: R31.9

## 2013-01-17 LAB — POCT URINALYSIS DIPSTICK
Leukocytes, UA: NEGATIVE
Protein, UA: NEGATIVE
Urobilinogen, UA: 0.2

## 2013-01-17 LAB — POCT UA - MICROSCOPIC ONLY

## 2013-01-17 MED ORDER — KETOROLAC TROMETHAMINE 60 MG/2ML IM SOLN
60.0000 mg | Freq: Once | INTRAMUSCULAR | Status: AC
  Administered 2013-01-17: 60 mg via INTRAMUSCULAR

## 2013-01-17 MED ORDER — TAMSULOSIN HCL 0.4 MG PO CAPS: 0.4000 mg | ORAL_CAPSULE | Freq: Every day | ORAL | Status: DC

## 2013-01-17 NOTE — Assessment & Plan Note (Addendum)
Etiology unclear but gall bladder and kidney stones remain most likely vs psychosomatic. No longer associated w/ movement, and not associated w/ biliary type colic w/ food.  Pt abdominal pian on exam not consistent today Recent CT w/o evidence of stones but R flank episodic pain complaint w/ radiation to groin w/ hematuria on UA most consistent w/ nephrolithiasis.  Will have pt cont to strain urine, Toradol 60mg  in office, flomax ordered Urology consult  Return to [facility] for further evaluation for possible cholecystectomy.

## 2013-01-17 NOTE — Progress Notes (Signed)
Derrick Ortiz is a 42 y.o. male who presents to [facility] today for SD appt for side pain  R flank pain w/ radiation down to groin area. Episodic. Comes at random times but worse w/ straining. Nauseous and vomiting on Friday. Bowels daily. Urine becoming darker yellow over the weekend, but now lighter. Pain is achy. Denies dysuria, penile discharge. Steak on Friday w/ heart burn and nausea. Vicodin w/o relief of pain. No scrotal swelling. Previous ventral hernia repair. Previously evaluated for kidney stones here in office and provided strainer w/o stones  Tobacco: 1-2 cig per week  The following portions of the patient's history were reviewed and updated as appropriate: allergies, current medications, past medical history, family and social history, and problem list.  Patient is a smoker.  Past Medical History  Diagnosis Date  . Hypertension     ROS as above otherwise neg.    Medications reviewed. Current Outpatient Prescriptions  Medication Sig Dispense Refill  . ARIPiprazole (ABILIFY) 10 MG tablet Take 1 tablet (10 mg total) by mouth daily.  30 tablet  1  . HYDROcodone-acetaminophen (NORCO) 10-325 MG per tablet Take 1 tablet by mouth every 8 (eight) hours as needed for pain.  20 tablet  0  . naproxen (NAPROSYN) 500 MG tablet Take 1 tablet (500 mg total) by mouth 2 (two) times daily with a meal. For leg pain.  60 tablet  5  . omeprazole (PRILOSEC) 20 MG capsule Take 1 capsule (20 mg total) by mouth at bedtime. For indigestion and stomach problems.  30 capsule  5  . propranolol (INDERAL LA) 80 MG 24 hr capsule Take 1 capsule (80 mg total) by mouth daily. For prevention of migraine headaches.  30 capsule  5  . SUMAtriptan (IMITREX) 100 MG tablet One tablet by mouth for severe headache, may repeat in 2 hours if headache persists.  9 tablet  5  . tamsulosin (FLOMAX) 0.4 MG CAPS capsule Take 1 capsule (0.4 mg total) by mouth daily.  30 capsule  3   No current facility-administered medications for  this visit.    Exam:  BP 124/84  Pulse 76  Temp(Src) 98.1 F (36.7 C) (Oral)  Ht 5\' 11"  (1.803 m)  Wt 217 lb (98.431 kg)  BMI 30.28 kg/m2 Gen: Well NAD HEENT: EOMI,  MMM Abd: R cva tenderness. No pain at Mcburny's point or murphys sign. NABS. Intermittent ttp of abdomen that changed w/ time. No recurrence of ventral hernia GU: no scrotal mass or bowel felt in inguinal canal.  Results for orders placed in visit on 01/17/13 (from the past 72 hour(s))  POCT URINALYSIS DIPSTICK     Status: Abnormal   Collection Time    01/17/13 10:00 AM      Result Value Range   Color, UA YELLOW     Clarity, UA CLEAR     Glucose, UA NEG     Bilirubin, UA NEG     Ketones, UA NEG     Spec Grav, UA >=1.030     Blood, UA MODERATE     pH, UA 6.0     Protein, UA NEG     Urobilinogen, UA 0.2     Nitrite, UA NEG     Leukocytes, UA Negative

## 2013-01-17 NOTE — Addendum Note (Signed)
Addended by: Jone Baseman D on: 01/17/2013 11:05 AM   Modules accepted: Orders

## 2013-01-17 NOTE — Patient Instructions (Addendum)
The cause of your pain is unclear Please continue to strain your urine and look for kidney stones I think you should go see a urologist because of your continued blood in your urine and concern for possible kidney stones Please start taking flomax to help pass any possible stones Please follow up with the surgery team about possibly having your gallbladder taken out Keep a detailed journal on the causes of your symptoms.   Kidney Stones Kidney stones (ureteral lithiasis) are deposits that form inside your kidneys. The intense pain is caused by the stone moving through the urinary tract. When the stone moves, the ureter goes into spasm around the stone. The stone is usually passed in the urine.  CAUSES   A disorder that makes certain neck glands produce too much parathyroid hormone (primary hyperparathyroidism).  A buildup of uric acid crystals.  Narrowing (stricture) of the ureter.  A kidney obstruction present at birth (congenital obstruction).  Previous surgery on the kidney or ureters.  Numerous kidney infections. SYMPTOMS   Feeling sick to your stomach (nauseous).  Throwing up (vomiting).  Blood in the urine (hematuria).  Pain that usually spreads (radiates) to the groin.  Frequency or urgency of urination. DIAGNOSIS   Taking a history and physical exam.  Blood or urine tests.  Computerized X-ray scan (CT scan).  Occasionally, an examination of the inside of the urinary bladder (cystoscopy) is performed. TREATMENT   Observation.  Increasing your fluid intake.  Surgery may be needed if you have severe pain or persistent obstruction. The size, location, and chemical composition are all important variables that will determine the proper choice of action for you. Talk to your caregiver to better understand your situation so that you will minimize the risk of injury to yourself and your kidney.  HOME CARE INSTRUCTIONS   Drink enough water and fluids to keep your  urine clear or pale yellow.  Strain all urine through the provided strainer. Keep all particulate matter and stones for your caregiver to see. The stone causing the pain may be as small as a grain of salt. It is very important to use the strainer each and every time you pass your urine. The collection of your stone will allow your caregiver to analyze it and verify that a stone has actually passed.  Only take over-the-counter or prescription medicines for pain, discomfort, or fever as directed by your caregiver.  Make a follow-up appointment with your caregiver as directed.  Get follow-up X-rays if required. The absence of pain does not always mean that the stone has passed. It may have only stopped moving. If the urine remains completely obstructed, it can cause loss of kidney function or even complete destruction of the kidney. It is your responsibility to make sure X-rays and follow-ups are completed. Ultrasounds of the kidney can show blockages and the status of the kidney. Ultrasounds are not associated with any radiation and can be performed easily in a matter of minutes. SEEK IMMEDIATE MEDICAL CARE IF:   Pain cannot be controlled with the prescribed medicine.  You have a fever.  The severity or intensity of pain increases over 18 hours and is not relieved by pain medicine.  You develop a new onset of abdominal pain.  You feel faint or pass out. MAKE SURE YOU:   Understand these instructions.  Will watch your condition.  Will get help right away if you are not doing well or get worse. Document Released: 05/19/2005 Document Revised: 08/11/2011 Document Reviewed:  09/14/2009 ExitCare Patient Information 2014 [place], [place].

## 2013-01-17 NOTE — Telephone Encounter (Signed)
LMOV pt to contact our office.  LFT's were normal.  He is at risk for gallbladder disease, but would be associated with his diet only.  Remind pt to f/u with his PCP if no improvement in his flank pain.

## 2013-01-18 NOTE — Telephone Encounter (Signed)
Return call from patient-  Patient advised of LFT's results (normal LFT's) Patient made aware that he is at risk for gallbladder disease, but would be associated with his diet only. Remind pt to f/u with his PCP if no improvement in his flank pain.  Patient reports that he was seen by his PCP today for nausea, vomiting, groin pain, lower abd pain, blood in urine as well.  Patient states he's negative for possible hernia.  Patient reports that his PCP would like for patient to be seen again by Dr. Donell Beers.  Patient aware that I will forward a message to Dr. Donell Beers and her assistant Cyndra Numbers)

## 2013-01-19 NOTE — Telephone Encounter (Signed)
Can you order a HIDA with [facility] on this pt?  tx FB

## 2013-01-20 ENCOUNTER — Telehealth (INDEPENDENT_AMBULATORY_CARE_PROVIDER_SITE_OTHER): Payer: Self-pay | Admitting: General Surgery

## 2013-01-20 ENCOUNTER — Other Ambulatory Visit (INDEPENDENT_AMBULATORY_CARE_PROVIDER_SITE_OTHER): Payer: Self-pay | Admitting: General Surgery

## 2013-01-20 DIAGNOSIS — R1032 Left lower quadrant pain: Secondary | ICD-10-CM

## 2013-01-20 NOTE — Telephone Encounter (Signed)
Spoke with pt and informed him that he is set up with [facility] radiology for a NM hepato eject Fract on 01/27/13 at 8:45.  Explained that he needs to be NPO after midnight.

## 2013-01-27 ENCOUNTER — Other Ambulatory Visit (INDEPENDENT_AMBULATORY_CARE_PROVIDER_SITE_OTHER): Payer: Self-pay | Admitting: General Surgery

## 2013-01-27 ENCOUNTER — Encounter (HOSPITAL_COMMUNITY)
Admission: RE | Admit: 2013-01-27 | Discharge: 2013-01-27 | Disposition: A | Discharge status: Home / Self Care | Payer: Medicaid Other | Source: Ambulatory Visit | Attending: General Surgery | Admitting: General Surgery

## 2013-01-27 DIAGNOSIS — R1032 Left lower quadrant pain: Secondary | ICD-10-CM

## 2013-01-27 MED ORDER — TECHNETIUM TC 99M MEBROFENIN IV KIT
5.0000 | PACK | Freq: Once | INTRAVENOUS | Status: AC | PRN
  Administered 2013-01-27: 5 via INTRAVENOUS

## 2013-01-27 MED ORDER — MORPHINE SULFATE 4 MG/ML IJ SOLN
INTRAMUSCULAR | Status: AC
  Administered 2013-01-27: 4 mg
  Filled 2013-01-27: qty 1

## 2013-01-27 MED ORDER — MORPHINE SULFATE 4 MG/ML IJ SOLN: 4.0000 mg | Freq: Once | INTRAMUSCULAR | Status: DC

## 2013-01-27 NOTE — Progress Notes (Signed)
Quick Note:  Please let patient know that his gallbladder did look abnormal on the hida scan. We can schedule him to get gallbladder out.  Please let me know if pt would like to schedule surgery or if he would like to come back to discuss in clinic.  tx  FB ______

## 2013-02-08 HISTORY — PX: CYSTOSCOPY: SUR368

## 2013-02-09 ENCOUNTER — Encounter: Payer: Self-pay | Admitting: Family Medicine

## 2013-02-26 ENCOUNTER — Emergency Department (INDEPENDENT_AMBULATORY_CARE_PROVIDER_SITE_OTHER)
Admission: EM | Admit: 2013-02-26 | Discharge: 2013-02-26 | Disposition: A | Discharge status: Home / Self Care | Payer: Medicaid Other | Source: Home / Self Care

## 2013-02-26 ENCOUNTER — Encounter (HOSPITAL_COMMUNITY): Payer: Self-pay | Admitting: Emergency Medicine

## 2013-02-26 DIAGNOSIS — J069 Acute upper respiratory infection, unspecified: Secondary | ICD-10-CM

## 2013-02-26 MED ORDER — FLUTICASONE PROPIONATE 50 MCG/ACT NA SUSP: 2.0000 | Freq: Every day | NASAL | Status: DC

## 2013-02-26 MED ORDER — GUAIFENESIN-CODEINE 100-10 MG/5ML PO SOLN: 5.0000 mL | Freq: Three times a day (TID) | ORAL | Status: DC | PRN

## 2013-02-26 NOTE — ED Notes (Signed)
Pt is here for cold sxs onset yest Sxs include: productive cough, nasal congestion, runny nose, vomiting due to cough Denies: fevers, diarrhea Taking mucinex and applying Vicks vaporizer w/no relief He is alert w/no signs of acute distress.

## 2013-02-26 NOTE — ED Provider Notes (Signed)
CSN: [id number]     Arrival date & time 02/26/13  0909 History   None    Chief Complaint  Patient presents with  . URI   (Consider location/radiation/quality/duration/timing/severity/associated sxs/prior Treatment) Patient is a 42 y.o. male presenting with URI.  URI Presenting symptoms: no cough, no fatigue and no fever   Associated symptoms: no wheezing     Cough: started yesterday.Vicks vapor rub, Mucinex w/o benefit. Associated w/ runny nose, and HA, and mildly sore throat. Denies fever, nausea vomiting, CP, SOB, . Daughter w/ similar symptoms. No allergies. Keeps up at night.   Past Medical History  Diagnosis Date  . Hypertension    Past Surgical History  Procedure Laterality Date  . Femoral artery repair  1996    S/P Femoral-popliteal artery repair (Dr. Nedra Hai)  after traumatic injury in 1996  . Orif femur fracture  1996  . Inguinal hernia repair  2008    [facility]  . Hernia repair  2008    ventral hernia repair  . Cystoscopy  02/08/2013    Dr B. [facility] ([facility])   Family History  Problem Relation Age of Onset  . Diabetes type II Father   . Hypertension Father    History  Substance Use Topics  . Smoking status: Current Every Day Smoker -- 0.25 packs/day  . Smokeless tobacco: Never Used     Comment: smokes approximately 2 cigarettes per week  . Alcohol Use: No    Review of Systems  Constitutional: Negative for fever, chills, diaphoresis and fatigue.  Respiratory: Negative for cough, shortness of breath, wheezing and stridor.   Cardiovascular: Negative for chest pain.  All other systems reviewed and are negative.    Allergies  Review of patient's allergies indicates no known allergies.  Home Medications   Current Outpatient Rx  Name  Route  Sig  Dispense  Refill  . ARIPiprazole (ABILIFY) 10 MG tablet   Oral   Take 1 tablet (10 mg total) by mouth daily.   30 tablet   1   . fluticasone (FLONASE) 50 MCG/ACT nasal spray   Nasal   Place 2 sprays into the nose daily.   16 g   2   . guaiFENesin-codeine 100-10 MG/5ML syrup   Oral   Take 5 mLs by mouth 3 (three) times daily as needed for cough.   120 mL   0   . HYDROcodone-acetaminophen (NORCO) 10-325 MG per tablet   Oral   Take 1 tablet by mouth every 8 (eight) hours as needed for pain.   20 tablet   0   . naproxen (NAPROSYN) 500 MG tablet   Oral   Take 1 tablet (500 mg total) by mouth 2 (two) times daily with a meal. For leg pain.   60 tablet   5   . omeprazole (PRILOSEC) 20 MG capsule   Oral   Take 1 capsule (20 mg total) by mouth at bedtime. For indigestion and stomach problems.   30 capsule   5   . propranolol (INDERAL LA) 80 MG 24 hr capsule   Oral   Take 1 capsule (80 mg total) by mouth daily. For prevention of migraine headaches.   30 capsule   5   . SUMAtriptan (IMITREX) 100 MG tablet      One tablet by mouth for severe headache, may repeat in 2 hours if headache persists.   9 tablet   5   . tamsulosin (FLOMAX) 0.4 MG CAPS capsule  Oral   Take 1 capsule (0.4 mg total) by mouth daily.   30 capsule   3    BP 134/93  Pulse 78  Temp(Src) 98.7 F (37.1 C) (Oral)  Resp 17  SpO2 97% Physical Exam  Constitutional: He appears well-developed and well-nourished. No distress.  HENT:  Head: Normocephalic.  Mouth/Throat: No oropharyngeal exudate.  Boggy nasal turbinates, pharyngeal cobblestoning.   Eyes: EOM are normal. Pupils are equal, round, and reactive to light.  Neck: Normal range of motion.  Cardiovascular: Normal rate and normal heart sounds.   Pulmonary/Chest: Effort normal and breath sounds normal. No respiratory distress. He has no wheezes. He has no rales. He exhibits no tenderness.  Abdominal: Soft. He exhibits no distension.  Musculoskeletal: Normal range of motion.  Neurological: He is alert.  Skin: Skin is warm. No rash noted. No erythema. No pallor.  Psychiatric: He has a normal mood and affect. His behavior is  normal. Judgment and thought content normal.    ED Course  Procedures (including critical care time) Labs Review Labs Reviewed - No data to display Imaging Review No results found.  MDM   1. Viral URI with cough    42yo M w/ viral URI. [place] to resolve in 3-5 days.  - Robitussin AC, Flonase prescribed. - recommended NSAID, delsym, and saline nasal spray - precautions given - all questions answered  ,Shelly Flatten, MD Family Medicine PGY-3 02/26/2013, 10:25 AM      Ozella Rocks, MD 02/26/13 1025

## 2013-02-26 NOTE — ED Provider Notes (Signed)
Medical screening examination/treatment/procedure(s) were performed by a resident physician and as supervising physician I was immediately available for consultation/collaboration.  Leslee Home, M.D.   Reuben Likes, MD 02/26/13 1043

## 2013-03-18 ENCOUNTER — Encounter (INDEPENDENT_AMBULATORY_CARE_PROVIDER_SITE_OTHER): Payer: Self-pay | Admitting: General Surgery

## 2013-03-18 ENCOUNTER — Ambulatory Visit (INDEPENDENT_AMBULATORY_CARE_PROVIDER_SITE_OTHER): Payer: Medicaid Other | Admitting: General Surgery

## 2013-03-18 VITALS — BP 120/80 | HR 80 | Temp 98.4°F | Resp 16 | Ht 71.0 in | Wt 219.4 lb

## 2013-03-18 DIAGNOSIS — K828 Other specified diseases of gallbladder: Secondary | ICD-10-CM

## 2013-03-18 HISTORY — DX: Other specified diseases of gallbladder: K82.8

## 2013-03-18 NOTE — Progress Notes (Signed)
HISTORY: The patient is a 42-year-old male that fell several months ago for right flank pain. At that time, he seemed to have increased discomfort with movement. He also denied any postprandial symptoms. He did not have any significant nausea. However, this is not changed. He is having a constant sense of discomfort in his right upper abdomen. This is worse when he eats fatty foods. He underwent HIDA scan which demonstrated nonvisualization of the gallbladder.  He denies fevers and chills.   PERTINENT REVIEW OF SYSTEMS: Otherwise negative x 11.    Filed Vitals:   03/18/13 1045  BP: 120/80  Pulse: 80  Temp: 98.4 F (36.9 C)  Resp: 16   Filed Weights   03/18/13 1045  Weight: 219 lb 6.4 oz (99.519 kg)    EXAM: Head: Normocephalic and atraumatic.  Eyes:  Conjunctivae are normal. Pupils are equal, round, and reactive to light. No scleral icterus.  Neck:  Normal range of motion. Neck supple. No tracheal deviation present. No thyromegaly present.  Resp: No respiratory distress, normal effort. Abd:  Abdomen is soft, non distended and non distended.  Mild RUQ tenderness. No masses are palpable.  There is no rebound and no guarding.  Neurological: Alert and oriented to person, place, and time. Coordination normal.  Skin: Skin is warm and dry. No rash noted. No diaphoretic. No erythema. No pallor.  Psychiatric: Normal mood and affect. Normal behavior. Judgment and thought content normal.      ASSESSMENT AND PLAN:   Biliary dyskinesia Patient's constellation of symptoms are now consistent with gallbladder disease. He now has postprandial pain and nausea.  His flank pain has moved forward more into the right lateral abdomen.  We'll plan to do a laparoscopic cholecystectomy on him. He likely may not need a cholangiogram since he does have any stones. I will wait and see what his anatomy looks like before planning cholangiogram.  The surgical procedure was described to the patient in  detail.  The patient was given educational material. .  I discussed the incision type and location, the location of the gallbladder, the anatomy of the bile ducts and arteries, and the typical progression of surgery.  I discussed the possibility of converting to an open operation.  I advised of the risks of bleeding, infection, damage to other structures (such as the bile duct, intestine or liver), bile leak, need for other procedures or surgeries, and post op diarrhea/constipation.  We discussed the risk of blood clot.  We discussed the recovery period and post operative restrictions.  The patient was advised against taking blood thinners the week before surgery.           Camora Tremain L Sabastian Raimondi, MD Surgical Oncology, General & Endocrine Surgery [facility]  MCDIARMID,TODD D, MD No ref. provider found   

## 2013-03-18 NOTE — Patient Instructions (Signed)

## 2013-03-18 NOTE — Assessment & Plan Note (Signed)
Patient's constellation of symptoms are now consistent with gallbladder disease. He now has postprandial pain and nausea.  His flank pain has moved forward more into the right lateral abdomen.  We'll plan to do a laparoscopic cholecystectomy on him. He likely may not need a cholangiogram since he does have any stones. I will wait and see what his anatomy looks like before planning cholangiogram.  The surgical procedure was described to the patient in detail.  The patient was given Agricultural engineer. .  I discussed the incision type and location, the location of the gallbladder, the anatomy of the bile ducts and arteries, and the typical progression of surgery.  I discussed the possibility of converting to an open operation.  I advised of the risks of bleeding, infection, damage to other structures (such as the bile duct, intestine or liver), bile leak, need for other procedures or surgeries, and post op diarrhea/constipation.  We discussed the risk of blood clot.  We discussed the recovery period and post operative restrictions.  The patient was advised against taking blood thinners the week before surgery.

## 2013-03-21 ENCOUNTER — Encounter (HOSPITAL_COMMUNITY): Payer: Self-pay | Admitting: Pharmacy Technician

## 2013-03-22 ENCOUNTER — Encounter (HOSPITAL_COMMUNITY)
Admission: RE | Admit: 2013-03-22 | Discharge: 2013-03-22 | Disposition: A | Discharge status: Home / Self Care | Payer: Medicaid Other | Source: Ambulatory Visit | Attending: General Surgery | Admitting: General Surgery

## 2013-03-22 ENCOUNTER — Encounter (HOSPITAL_COMMUNITY): Payer: Self-pay

## 2013-03-22 HISTORY — DX: Gastro-esophageal reflux disease without esophagitis: K21.9

## 2013-03-22 HISTORY — DX: Headache: R51

## 2013-03-22 HISTORY — DX: Personal history of other diseases of the nervous system and sense organs: Z86.69

## 2013-03-22 HISTORY — DX: Unspecified osteoarthritis, unspecified site: M19.90

## 2013-03-22 LAB — URINALYSIS, ROUTINE W REFLEX MICROSCOPIC
Bilirubin Urine: NEGATIVE
Leukocytes, UA: NEGATIVE
Protein, ur: NEGATIVE mg/dL
Specific Gravity, Urine: 1.025 (ref 1.005–1.030)
Urobilinogen, UA: 1 mg/dL (ref 0.0–1.0)
pH: 6 (ref 5.0–8.0)

## 2013-03-22 LAB — COMPREHENSIVE METABOLIC PANEL
ALT: 23 U/L (ref 0–53)
AST: 27 U/L (ref 0–37)
Alkaline Phosphatase: 90 U/L (ref 39–117)
CO2: 26 mEq/L (ref 19–32)
Calcium: 9.6 mg/dL (ref 8.4–10.5)
Chloride: 103 mEq/L (ref 96–112)
GFR calc non Af Amer: >90 mL/min (ref >90)
Potassium: 4.1 mEq/L (ref 3.5–5.1)
Sodium: 137 mEq/L (ref 135–145)
Total Bilirubin: 0.7 mg/dL (ref 0.3–1.2)

## 2013-03-22 LAB — CBC WITH DIFFERENTIAL/PLATELET
Basophils Relative: 0 % (ref 0–1)
Eosinophils Absolute: 0.1 10*3/uL (ref 0.0–0.7)
HCT: 45.4 % (ref 39.0–52.0)
Hemoglobin: 15.5 g/dL (ref 13.0–17.0)
Lymphs Abs: 3.2 10*3/uL (ref 0.7–4.0)
MCH: 30.5 pg (ref 26.0–34.0)
MCHC: 34.1 g/dL (ref 30.0–36.0)
Monocytes Absolute: 0.7 10*3/uL (ref 0.1–1.0)
Monocytes Relative: 10 % (ref 3–12)
Neutro Abs: 2.7 10*3/uL (ref 1.7–7.7)
RBC: 5.08 MIL/uL (ref 4.22–5.81)

## 2013-03-22 LAB — URINE MICROSCOPIC-ADD ON

## 2013-03-22 MED ORDER — DEXTROSE 5 % IV SOLN
2.0000 g | INTRAVENOUS | Status: AC
  Administered 2013-03-23: 2 g via INTRAVENOUS
  Filled 2013-03-22: qty 2

## 2013-03-22 NOTE — Pre-Procedure Instructions (Signed)
FRANCK VINAL  03/22/2013   Your procedure is scheduled on:  Wednesday, October 22nd  Report to Main Entrance "A" and check in with admitting at 1:00 PM.  Call this number if you have problems the morning of surgery: [phone]   Remember:   Do not eat food or drink liquids after midnight.   Take these medicines the morning of surgery with A SIP OF WATER: prilosec, abilify, propranolol, flonase   Do not wear jewelry.  Do not wear lotions, powders, or perfumes. You may wear deodorant.  Do not shave 48 hours prior to surgery. Men may shave face and neck.  Do not bring valuables to the hospital.  [facility] is not responsible  for any belongings or valuables.               Contacts, dentures or bridgework may not be worn into surgery.  Leave suitcase in the car. After surgery it may be brought to your room.  For patients admitted to the hospital, discharge time is determined by your  treatment team.               Patients discharged the day of surgery will not be allowed to drive home.    Special Instructions: Shower using CHG 2 nights before surgery and the night before surgery.  If you shower the day of surgery use CHG.  Use special wash - you have one bottle of CHG for all showers.  You should use approximately 1/3 of the bottle for each shower.   Please read over the following fact sheets that you were given: Pain Booklet, Coughing and Deep Breathing and Surgical Site Infection Prevention

## 2013-03-22 NOTE — Progress Notes (Signed)
Primary physician - dr. Hinton Rao, [facility] Cardiologist - none No prior cardiac testing

## 2013-03-23 ENCOUNTER — Encounter (HOSPITAL_COMMUNITY)
Admission: RE | Disposition: A | Discharge status: Home / Self Care | Payer: Self-pay | Source: Ambulatory Visit | Attending: General Surgery

## 2013-03-23 ENCOUNTER — Encounter (HOSPITAL_COMMUNITY): Payer: Self-pay | Admitting: *Deleted

## 2013-03-23 ENCOUNTER — Ambulatory Visit (HOSPITAL_COMMUNITY): Payer: Medicaid Other | Admitting: Anesthesiology

## 2013-03-23 ENCOUNTER — Ambulatory Visit (HOSPITAL_COMMUNITY)
Admission: RE | Admit: 2013-03-23 | Discharge: 2013-03-23 | Disposition: A | Discharge status: Home / Self Care | Payer: Medicaid Other | Source: Ambulatory Visit | Attending: General Surgery | Admitting: General Surgery

## 2013-03-23 ENCOUNTER — Encounter (HOSPITAL_COMMUNITY): Payer: Medicaid Other | Admitting: Anesthesiology

## 2013-03-23 DIAGNOSIS — K8010 Calculus of gallbladder with chronic cholecystitis without obstruction: Secondary | ICD-10-CM

## 2013-03-23 DIAGNOSIS — K828 Other specified diseases of gallbladder: Secondary | ICD-10-CM | POA: Insufficient documentation

## 2013-03-23 DIAGNOSIS — K219 Gastro-esophageal reflux disease without esophagitis: Secondary | ICD-10-CM | POA: Insufficient documentation

## 2013-03-23 DIAGNOSIS — Z01812 Encounter for preprocedural laboratory examination: Secondary | ICD-10-CM | POA: Insufficient documentation

## 2013-03-23 HISTORY — PX: CHOLECYSTECTOMY: SHX55

## 2013-03-23 SURGERY — LAPAROSCOPIC CHOLECYSTECTOMY
Anesthesia: General | Wound class: Clean Contaminated

## 2013-03-23 MED ORDER — SODIUM CHLORIDE 0.9 % IV SOLN: 250.0000 mL | INTRAVENOUS | Status: DC | PRN

## 2013-03-23 MED ORDER — ONDANSETRON HCL 4 MG/2ML IJ SOLN
INTRAMUSCULAR | Status: DC | PRN
  Administered 2013-03-23 (×2): 4 mg via INTRAVENOUS

## 2013-03-23 MED ORDER — OXYCODONE HCL 5 MG PO TABS: 5.0000 mg | ORAL_TABLET | Freq: Once | ORAL | Status: DC | PRN

## 2013-03-23 MED ORDER — ACETAMINOPHEN 325 MG PO TABS: 650.0000 mg | ORAL_TABLET | ORAL | Status: DC | PRN

## 2013-03-23 MED ORDER — ONDANSETRON HCL 4 MG/2ML IJ SOLN: 4.0000 mg | Freq: Four times a day (QID) | INTRAMUSCULAR | Status: DC | PRN

## 2013-03-23 MED ORDER — ROCURONIUM BROMIDE 100 MG/10ML IV SOLN
INTRAVENOUS | Status: DC | PRN
  Administered 2013-03-23: 40 mg via INTRAVENOUS
  Administered 2013-03-23: 10 mg via INTRAVENOUS

## 2013-03-23 MED ORDER — LACTATED RINGERS IV SOLN
INTRAVENOUS | Status: DC | PRN
  Administered 2013-03-23 (×2): via INTRAVENOUS

## 2013-03-23 MED ORDER — HYDROMORPHONE HCL PF 1 MG/ML IJ SOLN: 0.2500 mg | INTRAMUSCULAR | Status: DC | PRN

## 2013-03-23 MED ORDER — 0.9 % SODIUM CHLORIDE (POUR BTL) OPTIME
TOPICAL | Status: DC | PRN
  Administered 2013-03-23: 2000 mL

## 2013-03-23 MED ORDER — SODIUM CHLORIDE 0.9 % IJ SOLN: 3.0000 mL | Freq: Two times a day (BID) | INTRAMUSCULAR | Status: DC

## 2013-03-23 MED ORDER — GLYCOPYRROLATE 0.2 MG/ML IJ SOLN
INTRAMUSCULAR | Status: DC | PRN
  Administered 2013-03-23: .8 mg via INTRAVENOUS

## 2013-03-23 MED ORDER — ACETAMINOPHEN 650 MG RE SUPP: 650.0000 mg | RECTAL | Status: DC | PRN

## 2013-03-23 MED ORDER — MIDAZOLAM HCL 5 MG/5ML IJ SOLN
INTRAMUSCULAR | Status: DC | PRN
  Administered 2013-03-23: 2 mg via INTRAVENOUS

## 2013-03-23 MED ORDER — OXYCODONE HCL 5 MG/5ML PO SOLN: 5.0000 mg | Freq: Once | ORAL | Status: DC | PRN

## 2013-03-23 MED ORDER — LIDOCAINE HCL 1 % IJ SOLN
INTRAMUSCULAR | Status: DC | PRN
  Administered 2013-03-23: 15:00:00 via SUBCUTANEOUS

## 2013-03-23 MED ORDER — CHLORHEXIDINE GLUCONATE 4 % EX LIQD: 1.0000 "application " | Freq: Once | CUTANEOUS | Status: DC

## 2013-03-23 MED ORDER — OXYCODONE HCL 5 MG PO TABS: 5.0000 mg | ORAL_TABLET | ORAL | Status: DC | PRN

## 2013-03-23 MED ORDER — PROPOFOL 10 MG/ML IV BOLUS
INTRAVENOUS | Status: DC | PRN
  Administered 2013-03-23: 50 mg via INTRAVENOUS
  Administered 2013-03-23: 100 mg via INTRAVENOUS
  Administered 2013-03-23: 50 mg via INTRAVENOUS
  Administered 2013-03-23: 200 mg via INTRAVENOUS

## 2013-03-23 MED ORDER — BUPIVACAINE-EPINEPHRINE PF 0.25-1:200000 % IJ SOLN
INTRAMUSCULAR | Status: AC
  Filled 2013-03-23: qty 30

## 2013-03-23 MED ORDER — FENTANYL CITRATE 0.05 MG/ML IJ SOLN
INTRAMUSCULAR | Status: DC | PRN
  Administered 2013-03-23: 100 ug via INTRAVENOUS
  Administered 2013-03-23 (×3): 50 ug via INTRAVENOUS

## 2013-03-23 MED ORDER — NEOSTIGMINE METHYLSULFATE 1 MG/ML IJ SOLN
INTRAMUSCULAR | Status: DC | PRN
  Administered 2013-03-23: 5 mg via INTRAVENOUS

## 2013-03-23 MED ORDER — LACTATED RINGERS IV SOLN: INTRAVENOUS | Status: DC

## 2013-03-23 MED ORDER — PROPOFOL 10 MG/ML IV BOLUS: INTRAVENOUS | Status: DC | PRN

## 2013-03-23 MED ORDER — LIDOCAINE HCL (PF) 1 % IJ SOLN
INTRAMUSCULAR | Status: AC
  Filled 2013-03-23: qty 30

## 2013-03-23 MED ORDER — PROMETHAZINE HCL 25 MG/ML IJ SOLN: 6.2500 mg | INTRAMUSCULAR | Status: DC | PRN

## 2013-03-23 MED ORDER — OXYCODONE-ACETAMINOPHEN 5-325 MG PO TABS: 1.0000 | ORAL_TABLET | ORAL | Status: DC | PRN

## 2013-03-23 MED ORDER — ARTIFICIAL TEARS OP OINT
TOPICAL_OINTMENT | OPHTHALMIC | Status: DC | PRN
  Administered 2013-03-23: 1 via OPHTHALMIC

## 2013-03-23 MED ORDER — LIDOCAINE HCL (CARDIAC) 20 MG/ML IV SOLN
INTRAVENOUS | Status: DC | PRN
  Administered 2013-03-23: 100 mg via INTRAVENOUS

## 2013-03-23 MED ORDER — SODIUM CHLORIDE 0.9 % IJ SOLN: 3.0000 mL | INTRAMUSCULAR | Status: DC | PRN

## 2013-03-23 MED ORDER — MEPERIDINE HCL 25 MG/ML IJ SOLN: 6.2500 mg | INTRAMUSCULAR | Status: DC | PRN

## 2013-03-23 MED ORDER — SODIUM CHLORIDE 0.9 % IR SOLN
Status: DC | PRN
  Administered 2013-03-23: 1000 mL

## 2013-03-23 MED ORDER — METOPROLOL TARTRATE 1 MG/ML IV SOLN
INTRAVENOUS | Status: DC | PRN
  Administered 2013-03-23 (×2): 2.5 mg via INTRAVENOUS

## 2013-03-23 SURGICAL SUPPLY — 48 items
ADH SKN CLS APL DERMABOND .7 (GAUZE/BANDAGES/DRESSINGS) ×1
APPLIER CLIP ROT 10 11.4 M/L (STAPLE) ×2
APR CLP MED LRG 11.4X10 (STAPLE) ×1
BAG SPEC RTRVL LRG 6X4 10 (ENDOMECHANICALS) ×1
BLADE SURG ROTATE 9660 (MISCELLANEOUS) ×1 IMPLANT
CANISTER SUCTION 2500CC (MISCELLANEOUS) ×2 IMPLANT
CHLORAPREP W/TINT 26ML (MISCELLANEOUS) ×2 IMPLANT
CLIP APPLIE ROT 10 11.4 M/L (STAPLE) ×1 IMPLANT
COVER MAYO STAND STRL (DRAPES) IMPLANT
COVER SURGICAL LIGHT HANDLE (MISCELLANEOUS) ×2 IMPLANT
DECANTER SPIKE VIAL GLASS SM (MISCELLANEOUS) ×4 IMPLANT
DERMABOND ADVANCED (GAUZE/BANDAGES/DRESSINGS) ×1
DERMABOND ADVANCED .7 DNX12 (GAUZE/BANDAGES/DRESSINGS) ×1 IMPLANT
DRAPE C-ARM 42X72 X-RAY (DRAPES) IMPLANT
DRAPE UTILITY 15X26 W/TAPE STR (DRAPE) ×4 IMPLANT
DRAPE WARM FLUID 44X44 (DRAPE) ×2 IMPLANT
ELECT REM PT RETURN 9FT ADLT (ELECTROSURGICAL) ×2
ELECTRODE REM PT RTRN 9FT ADLT (ELECTROSURGICAL) ×1 IMPLANT
FILTER SMOKE EVAC LAPAROSHD (FILTER) ×1 IMPLANT
GLOVE BIO SURGEON STRL SZ 6 (GLOVE) ×2 IMPLANT
GLOVE BIO SURGEON STRL SZ 6.5 (GLOVE) ×1 IMPLANT
GLOVE BIO SURGEON STRL SZ7.5 (GLOVE) ×1 IMPLANT
GLOVE BIOGEL PI IND STRL 6.5 (GLOVE) ×1 IMPLANT
GLOVE BIOGEL PI IND STRL 7.0 (GLOVE) IMPLANT
GLOVE BIOGEL PI IND STRL 7.5 (GLOVE) IMPLANT
GLOVE BIOGEL PI INDICATOR 6.5 (GLOVE) ×1
GLOVE BIOGEL PI INDICATOR 7.0 (GLOVE) ×2
GLOVE BIOGEL PI INDICATOR 7.5 (GLOVE) ×1
GOWN PREVENTION PLUS XXLARGE (GOWN DISPOSABLE) ×2 IMPLANT
GOWN STRL NON-REIN LRG LVL3 (GOWN DISPOSABLE) ×5 IMPLANT
KIT BASIN OR (CUSTOM PROCEDURE TRAY) ×2 IMPLANT
KIT ROOM TURNOVER OR (KITS) ×2 IMPLANT
NS IRRIG 1000ML POUR BTL (IV SOLUTION) ×3 IMPLANT
PAD ARMBOARD 7.5X6 YLW CONV (MISCELLANEOUS) ×4 IMPLANT
POUCH SPECIMEN RETRIEVAL 10MM (ENDOMECHANICALS) ×2 IMPLANT
SCISSORS LAP 5X35 DISP (ENDOMECHANICALS) ×1 IMPLANT
SET CHOLANGIOGRAPH 5 50 .035 (SET/KITS/TRAYS/PACK) IMPLANT
SET IRRIG TUBING LAPAROSCOPIC (IRRIGATION / IRRIGATOR) ×2 IMPLANT
SLEEVE ENDOPATH XCEL 5M (ENDOMECHANICALS) ×2 IMPLANT
SPECIMEN JAR SMALL (MISCELLANEOUS) ×2 IMPLANT
SUT MNCRL AB 4-0 PS2 18 (SUTURE) ×2 IMPLANT
TOWEL OR 17X24 6PK STRL BLUE (TOWEL DISPOSABLE) ×2 IMPLANT
TOWEL OR 17X26 10 PK STRL BLUE (TOWEL DISPOSABLE) ×2 IMPLANT
TRAY LAPAROSCOPIC (CUSTOM PROCEDURE TRAY) ×2 IMPLANT
TROCAR XCEL BLUNT TIP 100MML (ENDOMECHANICALS) ×2 IMPLANT
TROCAR XCEL NON-BLD 11X100MML (ENDOMECHANICALS) ×2 IMPLANT
TROCAR XCEL NON-BLD 5MMX100MML (ENDOMECHANICALS) ×2 IMPLANT
WATER STERILE IRR 1000ML POUR (IV SOLUTION) IMPLANT

## 2013-03-23 NOTE — Discharge Instructions (Signed)
[facility]      [facility] °[phone] ° °ABDOMINAL SURGERY: POST OP INSTRUCTIONS ° °Always review your discharge instruction sheet given to you by the facility where your surgery was performed. ° °IF YOU HAVE DISABILITY OR FAMILY LEAVE FORMS, YOU MUST BRING THEM TO THE OFFICE FOR PROCESSING.  PLEASE DO NOT GIVE THEM TO YOUR DOCTOR. ° °1. A prescription for pain medication may be given to you upon discharge.  Take your pain medication as prescribed, if needed.  If narcotic pain medicine is not needed, then you may take acetaminophen (Tylenol) or ibuprofen (Advil) as needed. °2. Take your usually prescribed medications unless otherwise directed. °3. If you need a refill on your pain medication, please contact your pharmacy. They will contact our office to request authorization.  Prescriptions will not be filled after 5pm or on week-ends. °4. You should follow a light diet the first few days after arrival home, such as soup and crackers, pudding, etc.unless your doctor has advised otherwise. A high-fiber, low fat diet can be resumed as tolerated.   Be sure to include lots of fluids daily. Most patients will experience some swelling and bruising on the chest and neck area.  Ice packs will help.  Swelling and bruising can take several days to resolve °5. Most patients will experience some swelling and bruising in the area of the incision. Ice pack will help. Swelling and bruising can take several days to resolve..  °6. It is common to experience some constipation if taking pain medication after surgery.  Increasing fluid intake and taking a stool softener will usually help or prevent this problem from occurring.  A mild laxative (Milk of Magnesia or Miralax) should be taken according to package directions if there are no bowel movements after 48 hours. °7.  If your surgeon used skin glue on the incision, you may shower in 48 hours.  The glue will flake off over the next 2-3 weeks.  Any sutures or staples  will be removed at the office during your follow-up visit. You may find that a light gauze bandage over your incision may keep your staples from being rubbed or pulled. You may shower and replace the bandage daily. °8. ACTIVITIES:  You may resume regular (light) daily activities beginning the next day--such as daily self-care, walking, climbing stairs--gradually increasing activities as tolerated.  You may have sexual intercourse when it is comfortable.  Refrain from any heavy lifting or straining until approved by your doctor. °a. You may drive when you no longer are taking prescription pain medication, you can comfortably wear a seatbelt, and you can safely maneuver your car and apply brakes °b. Return to Work: __________2 weeks if applicable_________________________ °9. You should see your doctor in the office for a follow-up appointment approximately two weeks after your surgery.  Make sure that you call for this appointment within a day or two after you arrive home to insure a convenient appointment time. °OTHER INSTRUCTIONS:  °_____________________________________________________________ °_____________________________________________________________ ° °WHEN TO CALL YOUR DOCTOR: °1. Fever over 101.0 °2. Inability to urinate °3. Nausea and/or vomiting °4. Extreme swelling or bruising °5. Continued bleeding from incision. °6. Increased pain, redness, or drainage from the incision. °7. Difficulty swallowing or breathing °8. Muscle cramping or spasms. °9. Numbness or tingling in hands or feet or around lips. ° °The clinic staff is available to answer your questions during regular business hours.  Please don’t hesitate to call and ask to speak to one of the nurses if you   have concerns. ° °For further questions, please visit [email or web address] ° ° ° °

## 2013-03-23 NOTE — Anesthesia Preprocedure Evaluation (Addendum)
Anesthesia Evaluation  Patient identified by MRN, date of birth, ID band Patient awake    Reviewed: Allergy & Precautions, H&P , NPO status , Patient's Chart, lab work & pertinent test results, reviewed documented beta blocker date and time   History of Anesthesia Complications (+) PONV and history of anesthetic complications  Airway Mallampati: II  Neck ROM: Full    Dental  (+) Dental Advisory Given   Pulmonary neg pulmonary ROS,          Cardiovascular negative cardio ROS  Rhythm:Regular Rate:Normal     Neuro/Psych  Headaches, PSYCHIATRIC DISORDERS  Neuromuscular disease    GI/Hepatic Neg liver ROS, GERD-  Medicated,  Endo/Other  negative endocrine ROS  Renal/GU negative Renal ROS     Musculoskeletal negative musculoskeletal ROS (+)   Abdominal   Peds  Hematology negative hematology ROS (+)   Anesthesia Other Findings   Reproductive/Obstetrics                          Anesthesia Physical Anesthesia Plan  ASA: II  Anesthesia Plan: General   Post-op Pain Management:    Induction: Intravenous  Airway Management Planned: Oral ETT  Additional Equipment:   Intra-op Plan:   Post-operative Plan: Extubation in OR  Informed Consent: I have reviewed the patients History and Physical, chart, labs and discussed the procedure including the risks, benefits and alternatives for the proposed anesthesia with the patient or authorized representative who has indicated his/her understanding and acceptance.   Dental advisory given  Plan Discussed with: CRNA  Anesthesia Plan Comments:         Anesthesia Quick Evaluation

## 2013-03-23 NOTE — Preoperative (Signed)
Beta Blockers   Reason not to administer Beta Blockers:Not Applicable 

## 2013-03-23 NOTE — Transfer of Care (Signed)
Immediate Anesthesia Transfer of Care Note  Patient: Derrick Ortiz  Procedure(s) Performed: Procedure(s): LAPAROSCOPIC CHOLECYSTECTOMY (N/A)  Patient Location: PACU  Anesthesia Type:General  Level of Consciousness: awake, alert  and oriented  Airway & Oxygen Therapy: Patient Spontanous Breathing and Patient connected to face mask oxygen  Post-op Assessment: Report given to PACU RN, Post -op Vital signs reviewed and stable and Patient moving all extremities  Post vital signs: Reviewed and stable  Complications: No apparent anesthesia complications

## 2013-03-23 NOTE — Anesthesia Procedure Notes (Signed)
Procedure Name: Intubation Date/Time: 03/23/2013 2:13 PM Performed by: Sherie Don Pre-anesthesia Checklist: Patient identified, Emergency Drugs available, Suction available, Patient being monitored and Timeout performed Patient Re-evaluated:Patient Re-evaluated prior to inductionOxygen Delivery Method: Circle system utilized Preoxygenation: Pre-oxygenation with 100% oxygen Intubation Type: IV induction Ventilation: Mask ventilation without difficulty Laryngoscope Size: Mac and 4 Grade View: Grade II Tube type: Oral Tube size: 7.5 mm Number of attempts: 1 Airway Equipment and Method: Stylet Placement Confirmation: positive ETCO2,  ETT inserted through vocal cords under direct vision and breath sounds checked- equal and bilateral Secured at: 22 cm Tube secured with: Tape Dental Injury: Teeth and Oropharynx as per pre-operative assessment

## 2013-03-23 NOTE — Anesthesia Postprocedure Evaluation (Signed)
Anesthesia Post Note  Patient: Derrick Ortiz  Procedure(s) Performed: Procedure(s) (LRB): LAPAROSCOPIC CHOLECYSTECTOMY (N/A)  Anesthesia type: General  Patient location: PACU  Post pain: Pain level controlled  Post assessment: Post-op Vital signs reviewed  Last Vitals: BP 156/107  Pulse 66  Temp(Src) 36.7 C  Resp 14  SpO2 98%  Post vital signs: Reviewed  Level of consciousness: sedated  Complications: No apparent anesthesia complications

## 2013-03-23 NOTE — Op Note (Signed)
Laparoscopic Cholecystectomy  Indications: This patient presents with biliary dyskinesia and will undergo laparoscopic cholecystectomy.  Pre-operative Diagnosis: biliary dyskinesia  Post-operative Diagnosis: Same  Surgeon: Almond Lint   Assistants: n/a  Anesthesia: General endotracheal anesthesia and local  Procedure Details  The patient was seen again in the Holding Room. The risks, benefits, complications, treatment options, and expected outcomes were discussed with the patient. The possibilities of  bleeding, recurrent infection, damage to nearby structures, the need for additional procedures, failure to diagnose a condition, the possible need to convert to an open procedure, and creating a complication requiring transfusion or operation were discussed with the patient. The likelihood of improving the patient's symptoms with return to their baseline status is good.    The patient and/or family concurred with the proposed plan, giving informed consent. The site of surgery properly noted. The patient was taken to Operating Room, and the procedure verified as Laparoscopic Cholecystectomy with Intraoperative Cholangiogram. A Time Out was held and the above information confirmed.  Prior to the induction of general anesthesia, antibiotic prophylaxis was administered. General endotracheal anesthesia was then administered and tolerated well. After the induction, the abdomen was prepped with Chloraprep and draped in the sterile fashion. The patient was positioned in the supine position.  Local anesthetic agent was injected into the skin near the umbilicus and an incision made. We dissected down to the abdominal fascia with blunt dissection.  The fascia was incised vertically and we entered the peritoneal cavity bluntly.  A pursestring suture of 0-Vicryl was placed around the fascial opening.  The Hasson cannula was inserted and secured with the stay suture.  Pneumoperitoneum was then created with CO2  and tolerated well without any adverse changes in the patient's vital signs. An 11-mm port was placed in the subxiphoid position.  Two 5-mm ports were placed in the right upper quadrant. All skin incisions were infiltrated with a local anesthetic agent before making the incision and placing the trocars.   We positioned the patient in reverse Trendelenburg, tilted slightly to the patient's left.  The gallbladder was identified, the fundus grasped and retracted cephalad. Adhesions were lysed bluntly and with the electrocautery where indicated, taking care not to injure any adjacent organs or viscus. The infundibulum was grasped and retracted laterally, exposing the peritoneum overlying the triangle of Calot. This was then divided and exposed in a blunt fashion. A critical view of the cystic duct and cystic artery was obtained.  The cystic duct was clearly identified and bluntly dissected circumferentially. The cystic duct was then ligated with clips and divided. The cystic artery was identified, dissected free, ligated with clips and divided as well.   The gallbladder was dissected from the liver bed in retrograde fashion with the electrocautery. The gallbladder was removed and placed in an Endocatch bag.  The gallbladder and Endocatch bag were then removed through the umbilical port site.  The liver bed was irrigated and inspected. Hemostasis was achieved with the electrocautery. Copious irrigation was utilized and was repeatedly aspirated until clear.    We again inspected the right upper quadrant for hemostasis.  Pneumoperitoneum was released as we removed the trocars.   The pursestring suture was used to close the umbilical fascia.  4-0 Monocryl was used to close the skin.   The skin was cleaned and dry, and Dermabond was applied. The patient was then extubated and brought to the recovery room in stable condition. Instrument, sponge, and needle counts were correct at closure and at the conclusion of  the  case.   Findings: Very enlarged gallbladder.    Estimated Blood Loss: min         Drains: none          Specimens: Gallbladder to pathology       Complications: None; patient tolerated the procedure well.         Disposition: PACU - hemodynamically stable.         Condition: stable

## 2013-03-23 NOTE — Interval H&P Note (Signed)
History and Physical Interval Note:  03/23/2013 5:42 PM  Derrick Ortiz  has presented today for surgery, with the diagnosis of biliary dyskinesia   The various methods of treatment have been discussed with the patient and family. After consideration of risks, benefits and other options for treatment, the patient has consented to  Procedure(s): LAPAROSCOPIC CHOLECYSTECTOMY (N/A) as a surgical intervention .  The patient's history has been reviewed, patient examined, no change in status, stable for surgery.  I have reviewed the patient's chart and labs.  Questions were answered to the patient's satisfaction.     Celena Lanius

## 2013-03-23 NOTE — H&P (View-Only) (Signed)
HISTORY: The patient is a 42 year old male that fell several months ago for right flank pain. At that time, he seemed to have increased discomfort with movement. He also denied any postprandial symptoms. He did not have any significant nausea. However, this is not changed. He is having a constant sense of discomfort in his right upper abdomen. This is worse when he eats fatty foods. He underwent HIDA scan which demonstrated nonvisualization of the gallbladder.  He denies fevers and chills.   PERTINENT REVIEW OF SYSTEMS: Otherwise negative x 11.    Filed Vitals:   03/18/13 1045  BP: 120/80  Pulse: 80  Temp: 98.4 F (36.9 C)  Resp: 16   Filed Weights   03/18/13 1045  Weight: 219 lb 6.4 oz (99.519 kg)    EXAM: Head: Normocephalic and atraumatic.  Eyes:  Conjunctivae are normal. Pupils are equal, round, and reactive to light. No scleral icterus.  Neck:  Normal range of motion. Neck supple. No tracheal deviation present. No thyromegaly present.  Resp: No respiratory distress, normal effort. Abd:  Abdomen is soft, non distended and non distended.  Mild RUQ tenderness. No masses are palpable.  There is no rebound and no guarding.  Neurological: Alert and oriented to person, place, and time. Coordination normal.  Skin: Skin is warm and dry. No rash noted. No diaphoretic. No erythema. No pallor.  Psychiatric: Normal mood and affect. Normal behavior. Judgment and thought content normal.      ASSESSMENT AND PLAN:   Biliary dyskinesia Patient's constellation of symptoms are now consistent with gallbladder disease. He now has postprandial pain and nausea.  His flank pain has moved forward more into the right lateral abdomen.  We'll plan to do a laparoscopic cholecystectomy on him. He likely may not need a cholangiogram since he does have any stones. I will wait and see what his anatomy looks like before planning cholangiogram.  The surgical procedure was described to the patient in  detail.  The patient was given Agricultural engineer. .  I discussed the incision type and location, the location of the gallbladder, the anatomy of the bile ducts and arteries, and the typical progression of surgery.  I discussed the possibility of converting to an open operation.  I advised of the risks of bleeding, infection, damage to other structures (such as the bile duct, intestine or liver), bile leak, need for other procedures or surgeries, and post op diarrhea/constipation.  We discussed the risk of blood clot.  We discussed the recovery period and post operative restrictions.  The patient was advised against taking blood thinners the week before surgery.           Maudry Diego, MD Surgical Oncology, General & Endocrine Surgery [facility]  MCDIARMID,TODD D, MD No ref. provider found

## 2013-03-24 ENCOUNTER — Encounter (HOSPITAL_COMMUNITY): Payer: Self-pay | Admitting: General Surgery

## 2013-03-30 ENCOUNTER — Ambulatory Visit: Payer: Medicaid Other

## 2013-04-11 ENCOUNTER — Encounter (INDEPENDENT_AMBULATORY_CARE_PROVIDER_SITE_OTHER): Payer: Self-pay

## 2013-04-11 ENCOUNTER — Ambulatory Visit (INDEPENDENT_AMBULATORY_CARE_PROVIDER_SITE_OTHER): Payer: Medicaid Other | Admitting: General Surgery

## 2013-04-11 ENCOUNTER — Encounter (INDEPENDENT_AMBULATORY_CARE_PROVIDER_SITE_OTHER): Payer: Self-pay | Admitting: General Surgery

## 2013-04-11 VITALS — BP 140/98 | HR 82 | Temp 98.6°F | Resp 14 | Ht 71.0 in | Wt 220.6 lb

## 2013-04-11 DIAGNOSIS — K828 Other specified diseases of gallbladder: Secondary | ICD-10-CM

## 2013-04-11 NOTE — Patient Instructions (Signed)
Most activities OK now.  Hold off on really heavy lifting or strenuous activity for another 1 week.  OK to drive.    Follow up as needed.  Call if you have significant diarrhea or loose stools.

## 2013-04-11 NOTE — Assessment & Plan Note (Signed)
Pt did have chronic cholecystitis and cholelithiasis.  No evidence of surgical complications.  Follow up as needed.  Pt advised to call if he develops diarrhea.

## 2013-04-11 NOTE — Progress Notes (Signed)
HISTORY: Pt is around 3 weeks post op from cholecystectomy.  He is doing very well.  He is no longer on pain pills.  He denies nausea, vomiting, or constipation.  He has had a few loose stools, but these only occur occasionally.  He denies fever/chills.  He feels much better than before surgery.      EXAM: General:  Alert and oriented.   Incision:  abd soft.  Incisions healing well.     PATHOLOGY: Chronic cholecystitis and cholelithiasis   ASSESSMENT AND PLAN:   Biliary dyskinesia Pt did have chronic cholecystitis and cholelithiasis.  No evidence of surgical complications.  Follow up as needed.  Pt advised to call if he develops diarrhea.        Maudry Diego, MD Surgical Oncology, General & Endocrine Surgery [facility]  MCDIARMID,TODD D, MD No ref. provider found

## 2013-05-02 ENCOUNTER — Encounter: Payer: Self-pay | Admitting: Family Medicine

## 2013-05-02 ENCOUNTER — Ambulatory Visit (INDEPENDENT_AMBULATORY_CARE_PROVIDER_SITE_OTHER): Payer: Medicaid Other | Admitting: Family Medicine

## 2013-05-02 VITALS — BP 124/79 | HR 96 | Temp 98.8°F | Wt 220.0 lb

## 2013-05-02 DIAGNOSIS — M7521 Bicipital tendinitis, right shoulder: Secondary | ICD-10-CM

## 2013-05-02 DIAGNOSIS — M79601 Pain in right arm: Secondary | ICD-10-CM

## 2013-05-02 DIAGNOSIS — M7520 Bicipital tendinitis, unspecified shoulder: Secondary | ICD-10-CM

## 2013-05-02 DIAGNOSIS — M79609 Pain in unspecified limb: Secondary | ICD-10-CM

## 2013-05-02 MED ORDER — MELOXICAM 15 MG PO TABS: 15.0000 mg | ORAL_TABLET | Freq: Every day | ORAL | Status: DC

## 2013-05-02 NOTE — Patient Instructions (Addendum)
Wear arm sling during day. Take sling off three times a day to do your arm exercises Ice down your arm for 15 minutes three times a day after exercises.   Take one Meloxicam tablet daily for next 7 days to treat the pain  Go for your Sports Medicine evaluation.

## 2013-05-03 ENCOUNTER — Encounter: Payer: Self-pay | Admitting: Family Medicine

## 2013-05-03 DIAGNOSIS — M79601 Pain in right arm: Secondary | ICD-10-CM | POA: Insufficient documentation

## 2013-05-03 NOTE — Progress Notes (Signed)
   Subjective:    Patient ID: Derrick Ortiz, male    DOB: 10/19/1970, 42 y.o.   MRN: [id number]  HPI Right arm pain Onset: about two weeks ago but has gotten worse in last  Week. Began after elective cholecystectomy 03/23/13.  Location: crook of elbow and back of arm Quality: aching and throbbing Severity: moderate Function: difficult to use right arm.  Pt is right handed.  Pattern: constant Course: stable  Radiation: from back of right wrist to elbow Relief: tight wrap around right wrist Precipitant: none recalled but pain began after recent cholecystectomy surgery (03/23/13) Associated Symptoms: no fever. No other pain sites.  Trauma (Acute or Chronic): Prior Diagnostic Testing or Treatments: none Relevant PMH/PSH: Worked out with heavy weights over ten years ago.  Smoking status reviewed.   Review of Systems  See HPI No neck pain.  No shoulder pain.  No numbness or tingling in right arm.     Objective:   Physical Exam  General: No acute distress, cooperative Right arm: no deformity of arm or forearm. No ecchymosis.  Palpation: Tender in soft tissue of distal forearm and in antecubital fossa. ROM: FROM wrist and elbow.  Pain with movement of wrist and elbow actively Passive ROM of wrist and elbow without pain.  Resisted Wrist extension with moderate pain in distal arm and antecubital fossa Resisted elbow flexion with severe pain in antecubital fossa.  Strength: Able to break wrist flexion but not extension. Able to break elbow flexion but not extension.  Radial pulse (+) Intact fine touch median, radial and ulnar nn distributions.         Assessment & Plan:

## 2013-05-03 NOTE — Assessment & Plan Note (Signed)
Pain seems to be worse at right antecubital fossa right around the biceps insertion tendon. Do not see clear evidence of a tendon rupture.   Differential includes Bicipital tendinosis, antecubital bursitis, or possible entrapment of lateral antebrachial cutaneous nerve.   Will start with conservative treatment of relative rest, ice, gentle ROM excercises and short course of NSAID. Referred to Sports Medicine for evaluation and treatment.

## 2013-05-18 ENCOUNTER — Ambulatory Visit: Payer: Medicaid Other | Admitting: Sports Medicine

## 2013-06-20 ENCOUNTER — Encounter: Payer: Self-pay | Admitting: Family Medicine

## 2013-06-20 ENCOUNTER — Ambulatory Visit (INDEPENDENT_AMBULATORY_CARE_PROVIDER_SITE_OTHER): Payer: Medicaid Other | Admitting: Family Medicine

## 2013-06-20 VITALS — BP 124/84 | HR 79 | Temp 98.2°F | Wt 220.0 lb

## 2013-06-20 DIAGNOSIS — F529 Unspecified sexual dysfunction not due to a substance or known physiological condition: Secondary | ICD-10-CM

## 2013-06-20 DIAGNOSIS — R37 Sexual dysfunction, unspecified: Secondary | ICD-10-CM

## 2013-06-20 MED ORDER — SUMATRIPTAN SUCCINATE 100 MG PO TABS: 100.0000 mg | ORAL_TABLET | ORAL | Status: DC | PRN

## 2013-06-20 MED ORDER — NAPROXEN 500 MG PO TABS: 500.0000 mg | ORAL_TABLET | Freq: Two times a day (BID) | ORAL | Status: DC

## 2013-06-20 MED ORDER — ARIPIPRAZOLE 10 MG PO TABS: 10.0000 mg | ORAL_TABLET | Freq: Every day | ORAL | Status: DC

## 2013-06-20 MED ORDER — PROPRANOLOL HCL ER 80 MG PO CP24: 80.0000 mg | ORAL_CAPSULE | Freq: Every day | ORAL | Status: DC

## 2013-06-20 MED ORDER — OMEPRAZOLE 20 MG PO CPDR: 20.0000 mg | DELAYED_RELEASE_CAPSULE | Freq: Every day | ORAL | Status: DC

## 2013-06-20 NOTE — Patient Instructions (Signed)
I have refilled your prescriptions at [facility].  I have put in the referral for you, expect to hear from [place] in a few weeks with the appointment. Let me know if you need anything.  Juanita Devincent M. Paris Hohn, M.D.

## 2013-06-21 DIAGNOSIS — R37 Sexual dysfunction, unspecified: Secondary | ICD-10-CM

## 2013-06-21 HISTORY — DX: Sexual dysfunction, unspecified: R37

## 2013-06-21 NOTE — Assessment & Plan Note (Signed)
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 Able to obtain nighttime erection. Patient has depression, and also seems to have a tenuous relationship with his wife. I have encouraged both partners to stimulate each other and work together. Will also restart patients Abilify. He would like to be referred to urology to discuss options for his ED, and this referral has been placed.

## 2013-06-21 NOTE — Progress Notes (Signed)
Patient ID: Derrick Ortiz, male   DOB: 1971-05-21, 43 y.o.   MRN: [id number]    Subjective: HPI: Patient is a 43 y.o. male presenting to clinic today for concern of sexual dysfunction.  Erectile Dysfunction Patient complains of erectile dysfunction. Onset of dysfunction was several months ago and was gradual in onset.  Patient states the nature of difficulty is both attaining and maintaining erection. Full erections occur while asleep. Partial erections occur with masturbation. Libido is affected. Risk factors for ED include: None. Patient denies history of diabetes mellitus, pelvic radiation and beta blocker use. Patient's expectations as to sexual function: Would like to maintain full erection with intercourse.  Patient's description of relationship with partner is fair; she is present in the room today. She states that she has low libido due to her mental illness and does not have a desire to help stimulate him. He states this is causing a lot of arguments in their relationship. Previous treatment of ED includes none.  Patient also has history of depression that is not currently treated.    History Reviewed: Daily smoker.  ROS: Please see HPI above.  Objective: Office vital signs reviewed. BP 124/84  Pulse 79  Temp(Src) 98.2 F (36.8 C) (Oral)  Wt 220 lb (99.791 kg)  Physical Examination:  General: Awake, alert. NAD HEENT: Atraumatic, normocephalic Pulm: CTAB, no wheezes Cardio: RRR, no murmurs appreciated Neuro: Grossly intact  Assessment: 43 y.o. male with sexual dysfunction  Plan: See Problem List and After Visit Summary

## 2013-09-22 ENCOUNTER — Ambulatory Visit (INDEPENDENT_AMBULATORY_CARE_PROVIDER_SITE_OTHER): Payer: Medicaid Other | Admitting: Family Medicine

## 2013-09-22 ENCOUNTER — Encounter: Payer: Self-pay | Admitting: Family Medicine

## 2013-09-22 VITALS — BP 120/80 | HR 80 | Ht 71.0 in | Wt 223.0 lb

## 2013-09-22 DIAGNOSIS — R109 Unspecified abdominal pain: Secondary | ICD-10-CM

## 2013-09-22 DIAGNOSIS — R10A1 Flank pain, right side: Secondary | ICD-10-CM | POA: Insufficient documentation

## 2013-09-22 DIAGNOSIS — R52 Pain, unspecified: Secondary | ICD-10-CM

## 2013-09-22 DIAGNOSIS — R1011 Right upper quadrant pain: Secondary | ICD-10-CM

## 2013-09-22 HISTORY — DX: Unspecified abdominal pain: R10.9

## 2013-09-22 LAB — CBC WITH DIFFERENTIAL/PLATELET
BASOS ABS: 0 10*3/uL (ref 0.0–0.1)
Basophils Relative: 0 % (ref 0–1)
EOS ABS: 0.1 10*3/uL (ref 0.0–0.7)
EOS PCT: 1 % (ref 0–5)
HCT: 45.4 % (ref 39.0–52.0)
Hemoglobin: 15.8 g/dL (ref 13.0–17.0)
LYMPHS ABS: 2.6 10*3/uL (ref 0.7–4.0)
Lymphocytes Relative: 40 % (ref 12–46)
MCH: 30.7 pg (ref 26.0–34.0)
MCHC: 34.8 g/dL (ref 30.0–36.0)
MCV: 88.2 fL (ref 78.0–100.0)
Monocytes Absolute: 0.7 10*3/uL (ref 0.1–1.0)
Monocytes Relative: 11 % (ref 3–12)
Neutro Abs: 3.2 10*3/uL (ref 1.7–7.7)
Neutrophils Relative %: 48 % (ref 43–77)
PLATELETS: 264 10*3/uL (ref 150–400)
RBC: 5.15 MIL/uL (ref 4.22–5.81)
RDW: 14.5 % (ref 11.5–15.5)
WBC: 6.6 10*3/uL (ref 4.0–10.5)

## 2013-09-22 LAB — POCT UA - MICROSCOPIC ONLY

## 2013-09-22 LAB — COMPREHENSIVE METABOLIC PANEL
ALT: 18 U/L (ref 0–53)
AST: 20 U/L (ref 0–37)
Albumin: 4.3 g/dL (ref 3.5–5.2)
Alkaline Phosphatase: 87 U/L (ref 39–117)
BUN: 15 mg/dL (ref 6–23)
CO2: 27 mEq/L (ref 19–32)
Calcium: 9.5 mg/dL (ref 8.4–10.5)
Chloride: 103 mEq/L (ref 96–112)
Creat: 0.88 mg/dL (ref 0.50–1.35)
Glucose, Bld: 83 mg/dL (ref 70–99)
POTASSIUM: 4.3 meq/L (ref 3.5–5.3)
SODIUM: 137 meq/L (ref 135–145)
Total Bilirubin: 0.6 mg/dL (ref 0.2–1.2)
Total Protein: 7.1 g/dL (ref 6.0–8.3)

## 2013-09-22 LAB — POCT URINALYSIS DIPSTICK
Bilirubin, UA: NEGATIVE
GLUCOSE UA: NEGATIVE
KETONES UA: NEGATIVE
Leukocytes, UA: NEGATIVE
Nitrite, UA: NEGATIVE
Protein, UA: NEGATIVE
SPEC GRAV UA: 1.02
Urobilinogen, UA: 1
pH, UA: 6.5

## 2013-09-22 LAB — POCT SEDIMENTATION RATE: POCT SED RATE: 2 mm/hr (ref 0–22)

## 2013-09-22 LAB — CK: Total CK: 696 U/L — ABNORMAL HIGH (ref 7–232)

## 2013-09-22 LAB — LIPASE: Lipase: 19 U/L (ref 0–75)

## 2013-09-22 NOTE — Assessment & Plan Note (Signed)
Working explanation is chest and abdominal wall origin of pain. Differential includes Pleurisy, ureteral colic, renal disorder, rib fracture, PUD.   Will check UA, CMP, CBC, ESR.  Recommend to start APAP 1000 mg THREE TIMES DAILY for 5 days then as needed. Will discuss with osteopaths whether OMT would be helpful

## 2013-09-22 NOTE — Progress Notes (Signed)
   Subjective:    Patient ID: Derrick Ortiz, male    DOB: 10-07-1970, 43 y.o.   MRN: [id number]  Flank Pain Pertinent negatives include no chest pain or fever.   Onset: 4 days ago Location: right side of abdomen and back Quality: aching Severity: moderate Duration: 4 days Pattern: continuos Course: slowly improving Radiation: to right mid back Relief: pressing on right back Worse: lying to sitting, sitting to standing, deep inspiration.  Precipitant: no acute injury Associated Symptoms: No N/V, no change in bowel habits, no melena or hematochezia.  No weight loss, No dysuria or frequency. No hematuria.  No cough.  No shortness of breath.   Trauma (Acute or Chronic): none Prior Diagnostic Testing or Treatments: CT abdomin last year without kidney or ureteral stones Cystoscopy last year reported as normal per patient.   Relevant PMH/PSH: similar symptoms last year, treated with cholecystectomy and improved.     Review of Systems  Constitutional: Negative for fever, chills and unexpected weight change.  Cardiovascular: Negative for chest pain and leg swelling.  Gastrointestinal: Negative for abdominal distention.  Genitourinary: Positive for flank pain. Negative for urgency, discharge and testicular pain.  Musculoskeletal: Negative for arthralgias and joint swelling.  Skin: Negative for rash.  Neurological: Negative for dizziness and syncope.  Psychiatric/Behavioral: Positive for hallucinations (Chronic Auditory Hallucination not worse than usual  No command Hall.).       Objective:   Physical Exam  Constitutional: Vital signs are normal. He appears well-developed and well-nourished.  Non-toxic appearance.  Eyes: Conjunctivae are normal. Right conjunctiva is not injected.  Cardiovascular: Normal rate, regular rhythm and normal heart sounds.   Pulmonary/Chest: Effort normal and breath sounds normal.  Abdominal: Soft. Normal appearance and bowel sounds are normal. There is no  hepatosplenomegaly. There is no tenderness. There is no CVA tenderness, no tenderness at McBurney's point and negative Murphy's sign. Hernia confirmed negative in the ventral area.  Musculoskeletal:       Arms: Neurological: He is alert.          Assessment & Plan:

## 2013-09-22 NOTE — Patient Instructions (Signed)
I think this pain on your right side is coming from your muscles and ribs on the right side. I do not think the pain is coming from your kidney.  I am checking blood work to make sure it is not coming from organs inside your abdomen.  I will ask one of our osteopath doctors to see if they can do some manipulations to help relieve the pain Our office will contact you for an appointment with the osteopath doctor.  Take Tylenol (Acetaminophen) 500 mg, two tablet three times a day for next 5 days, then every eight hours if you need it.     Myalgia, Adult Myalgia is the medical term for muscle pain. It is a symptom of many things. Nearly everyone at some time in their life has this. The most common cause for muscle pain is overuse or straining and more so when you are not in shape. Injuries and muscle bruises cause myalgias. Muscle pain without a history of injury can also be caused by a virus. It frequently comes along with the flu. Myalgia not caused by muscle strain can be present in a large number of infectious diseases. Some autoimmune diseases like lupus and fibromyalgia can cause muscle pain. Myalgia may be mild, or severe. SYMPTOMS  The symptoms of myalgia are simply muscle pain. Most of the time this is short lived and the pain goes away without treatment. DIAGNOSIS  Myalgia is diagnosed by your caregiver by taking your history. This means you tell him when the problems began, what they are, and what has been happening. If this has not been a long term problem, your caregiver may want to watch for a while to see what will happen. If it has been long term, they may want to do additional testing. TREATMENT  The treatment depends on what the underlying cause of the muscle pain is. Often anti-inflammatory medications will help. HOME CARE INSTRUCTIONS  If the pain in your muscles came from overuse, slow down your activities until the problems go away.  Myalgia from overuse of a muscle can be  treated with alternating hot and cold packs on the muscle affected or with cold for the first couple days. If either heat or cold seems to make things worse, stop their use.  Apply ice to the sore area for 15-20 minutes, 03-04 times per day, while awake for the first 2 days of muscle soreness, or as directed. Put the ice in a plastic bag and place a towel between the bag of ice and your skin.  Only take over-the-counter or prescription medicines for pain, discomfort, or fever as directed by your caregiver.  Regular gentle exercise may help if you are not active.  Stretching before strenuous exercise can help lower the risk of myalgia. It is normal when beginning an exercise regimen to feel some muscle pain after exercising. Muscles that have not been used frequently will be sore at first. If the pain is extreme, this may mean injury to a muscle. SEEK MEDICAL CARE IF:  You have an increase in muscle pain that is not relieved with medication.  You begin to run a temperature.  You develop nausea and vomiting.  You develop a stiff and painful neck.  You develop a rash.  You develop muscle pain after a tick bite.  You have continued muscle pain while working out even after you are in good condition. SEEK IMMEDIATE MEDICAL CARE IF: Any of your problems are getting worse and medications are  not helping. MAKE SURE YOU:   Understand these instructions.  Will watch your condition.  Will get help right away if you are not doing well or get worse. Document Released: 04/10/2006 Document Revised: 08/11/2011 Document Reviewed: 06/30/2006 [facility] Patient Information 2014 [place], [place].

## 2013-09-23 ENCOUNTER — Encounter: Payer: Self-pay | Admitting: Family Medicine

## 2013-09-23 ENCOUNTER — Telehealth: Payer: Self-pay | Admitting: Family Medicine

## 2013-09-23 DIAGNOSIS — M6282 Rhabdomyolysis: Secondary | ICD-10-CM

## 2013-09-23 DIAGNOSIS — R109 Unspecified abdominal pain: Secondary | ICD-10-CM

## 2013-09-23 NOTE — Telephone Encounter (Signed)
Called to f/u sxs and discuss lab results  Talked about elevated serum CPK level  Drinking 2 shots liquor and 2 beers/day (+) marijuana.  (-) cocaine, (-) heroin/opiates  Patient agreed to return 09/26/13 to [facility] lab to look for systemic causes of elevated CPK, including HIV, Mg, Phos, TSH, Vitamin D, Collagen Vasc/vasculitis. Repeat Total CPK with MB isoenzymes, aldolase  If right flank pain persists, or worsens, will obtain CT with IV contrast to look at right lateral flank/right lateral abdomen muscles.     Discussed with radiology that CT with IV contrast would be the best imaging modality compared to MRI and [place] for initial evaluation.

## 2013-09-26 ENCOUNTER — Other Ambulatory Visit: Payer: Medicaid Other

## 2013-09-26 DIAGNOSIS — R109 Unspecified abdominal pain: Secondary | ICD-10-CM

## 2013-09-26 DIAGNOSIS — M6282 Rhabdomyolysis: Secondary | ICD-10-CM

## 2013-09-26 LAB — TSH: TSH: 1.077 u[IU]/mL (ref 0.350–4.500)

## 2013-09-26 LAB — PHOSPHORUS: Phosphorus: 2.9 mg/dL (ref 2.3–4.6)

## 2013-09-26 LAB — MAGNESIUM: Magnesium: 1.9 mg/dL (ref 1.5–2.5)

## 2013-09-26 NOTE — Progress Notes (Signed)
CK AND CKMB,HIV,MAG,PHOS,TSH,VIT D AND ANA DONE TODAY Derrick Ortiz

## 2013-09-27 ENCOUNTER — Encounter: Payer: Self-pay | Admitting: Family Medicine

## 2013-09-27 ENCOUNTER — Telehealth: Payer: Self-pay | Admitting: Family Medicine

## 2013-09-27 DIAGNOSIS — E559 Vitamin D deficiency, unspecified: Secondary | ICD-10-CM

## 2013-09-27 HISTORY — DX: Vitamin D deficiency, unspecified: E55.9

## 2013-09-27 LAB — ANA: Anti Nuclear Antibody(ANA): NEGATIVE

## 2013-09-27 LAB — CK TOTAL AND CKMB (NOT AT ARMC)
CK, MB: 4.3 ng/mL (ref 0.0–5.0)
RELATIVE INDEX: 0.8 (ref 0.0–4.0)
Total CK: 544 U/L — ABNORMAL HIGH (ref 7–232)

## 2013-09-27 LAB — HIV ANTIBODY (ROUTINE TESTING W REFLEX): HIV 1&2 Ab, 4th Generation: NONREACTIVE

## 2013-09-27 LAB — VITAMIN D 25 HYDROXY (VIT D DEFICIENCY, FRACTURES): Vit D, 25-Hydroxy: 15 ng/mL — ABNORMAL LOW (ref 30–89)

## 2013-09-27 MED ORDER — VITAMIN D (ERGOCALCIFEROL) 1.25 MG (50000 UNIT) PO CAPS: 50000.0000 [IU] | ORAL_CAPSULE | ORAL | Status: DC

## 2013-09-27 NOTE — Telephone Encounter (Signed)
Mr Derrick Ortiz reports that his side pain is improving.  He is taking Tylenol.   Pt informed of decline in CPK and other normal labs, including HIV and TSH.

## 2013-09-27 NOTE — Telephone Encounter (Signed)
Informed [name] of his Vitamin D deficiency Start ergocalciferol 50,000 units per oral weekly for 12 weeks, then 50,ooo units monthly.  Will check Vitamin D level after 12 week load.

## 2014-05-16 ENCOUNTER — Ambulatory Visit (INDEPENDENT_AMBULATORY_CARE_PROVIDER_SITE_OTHER): Payer: Medicaid Other | Admitting: Family Medicine

## 2014-05-16 ENCOUNTER — Encounter: Payer: Self-pay | Admitting: Family Medicine

## 2014-05-16 VITALS — BP 146/88 | HR 86 | Temp 98.5°F | Ht 71.0 in | Wt 224.0 lb

## 2014-05-16 DIAGNOSIS — M26629 Arthralgia of temporomandibular joint, unspecified side: Secondary | ICD-10-CM

## 2014-05-16 DIAGNOSIS — M2662 Arthralgia of temporomandibular joint: Secondary | ICD-10-CM

## 2014-05-16 HISTORY — DX: Arthralgia of temporomandibular joint, unspecified side: M26.629

## 2014-05-16 NOTE — Patient Instructions (Signed)
Temporomandibular Problems  Temporomandibular joint (TMJ) dysfunction means there are problems with the joint between your jaw and your skull. This is a joint lined by cartilage like other joints in your body but also has a small disc in the joint which keeps the bones from rubbing on each other. These joints are like other joints and can get inflamed (sore) from arthritis and other problems. When this joint gets sore, it can cause headaches and pain in the jaw and the face. CAUSES  Usually the arthritic types of problems are caused by soreness in the joint. Soreness in the joint can also be caused by overuse. This may come from grinding your teeth. It may also come from mis-alignment in the joint. DIAGNOSIS Diagnosis of this condition can often be made by history and exam. Sometimes your caregiver may need X-rays or an MRI scan to determine the exact cause. It may be necessary to see your dentist to determine if your teeth and jaws are lined up correctly. TREATMENT  Most of the time this problem is not serious; however, sometimes it can persist (become chronic). When this happens medications that will cut down on inflammation (soreness) help. Sometimes a shot of cortisone into the joint will be helpful. If your teeth are not aligned it may help for your dentist to make a splint for your mouth that can help this problem. If no physical problems can be found, the problem may come from tension. If tension is found to be the cause, biofeedback or relaxation techniques may be helpful. HOME CARE INSTRUCTIONS   Later in the day, applications of ice packs may be helpful. Ice can be used in a plastic bag with a towel around it to prevent frostbite to skin. This may be used about every 2 hours for 20 to 30 minutes, as needed while awake, or as directed by your caregiver.  Only take over-the-counter or prescription medicines for pain, discomfort, or fever as directed by your caregiver.  If physical therapy was  prescribed, follow your caregiver's directions.  Wear mouth appliances as directed if they were given. Document Released: 02/11/2001 Document Revised: 08/11/2011 Document Reviewed: 05/21/2008 [employer] Patient Information 2015 [employer]. This information is not intended to replace advice given to you by your health care provider. Make sure you discuss any questions you have with your health care provider.  

## 2014-05-16 NOTE — Assessment & Plan Note (Signed)
Pt H/P c/w TMJ disorder.   - Start with conservative management, handout given - Already on naprosyn, continue for now - Discussed jaw exercises and PT possibly if no improvement - F/U in 2-3 weeks.  If no improvement at that time as well, would consider referral to dentist for mouth guard at night - As well, would consider central acting muscle relaxer such as flexeril/skelaxin or TCA

## 2014-05-16 NOTE — Progress Notes (Signed)
Derrick Ortiz is a 43 y.o. male who presents today for ear ache.  Ear Ache - Pt states this has been ongoing now for about one month.  Never has had before and denies any inciting injury or change in work habit/status at that time, loud noise exposures.  States it is intermittent, worse in the AM, pain in the ear, HA at times, pain with mastication.  Wife along for visit and does state pt grinds teeth at night.  Does endorse some blood stained otorrhea yesterday, but this was s/p q-tip into the ear.  Has tried occasional ibuprofen which has helped some, otherwise no relief.    Past Medical History  Diagnosis Date  . GERD (gastroesophageal reflux disease)   . Headache(784.0)   . Hx of migraines     takes Propranolol and Imitrex prn migraines  . Arthritis   . History of hemorrhoids 08/09/2009    Qualifier: Diagnosis of  By: McDiarmid MD, Sherren Mocha    . HEMORRHOIDS, EXTERNAL 08/09/2009    Qualifier: Diagnosis of  By: McDiarmid MD, Sherren Mocha    . Vitamin D deficiency 09/27/2013    History  Smoking status  . Current Every Day Smoker -- 0.25 packs/day  Smokeless tobacco  . Never Used    Family History  Problem Relation Age of Onset  . Diabetes type II Father   . Hypertension Father     Current Outpatient Prescriptions on File Prior to Visit  Medication Sig Dispense Refill  . ARIPiprazole (ABILIFY) 10 MG tablet Take 1 tablet (10 mg total) by mouth daily. 30 tablet 11  . naproxen (NAPROSYN) 500 MG tablet Take 1 tablet (500 mg total) by mouth 2 (two) times daily with a meal. 60 tablet 5  . omeprazole (PRILOSEC) 20 MG capsule Take 1 capsule (20 mg total) by mouth at bedtime. 30 capsule 11  . propranolol ER (INDERAL LA) 80 MG 24 hr capsule Take 1 capsule (80 mg total) by mouth daily. Migraine prevention 30 capsule 11  . SUMAtriptan (IMITREX) 100 MG tablet Take 1 tablet (100 mg total) by mouth every 2 (two) hours as needed for migraine. May repeat in 2 hours if headache persists or recurs. 10 tablet 5   . Vitamin D, Ergocalciferol, (DRISDOL) 50000 UNITS CAPS capsule Take 1 capsule (50,000 Units total) by mouth every 7 (seven) days. For 12 weeks, then once a month. 30 capsule PRN   No current facility-administered medications on file prior to visit.    ROS: Per HPI.  All other systems reviewed and are negative.   Physical Exam Filed Vitals:   05/16/14 0925  BP: 146/88  Pulse: 86  Temp: 98.5 F (36.9 C)    Physical Examination: General appearance - alert, well appearing, and in no distress Ears - right ear normal, left ear normal, hearing grossly normal bilaterally Nose - normal and patent, no erythema, discharge or polyps Mouth - mucous membranes moist, pharynx normal without lesions  MSK: TTP at TMJ, pain with mastication

## 2014-09-26 ENCOUNTER — Ambulatory Visit (INDEPENDENT_AMBULATORY_CARE_PROVIDER_SITE_OTHER): Payer: Medicaid Other | Admitting: Family Medicine

## 2014-09-26 ENCOUNTER — Encounter: Payer: Self-pay | Admitting: Family Medicine

## 2014-09-26 VITALS — BP 125/84 | HR 92 | Temp 98.2°F | Ht 71.0 in | Wt 227.4 lb

## 2014-09-26 DIAGNOSIS — F419 Anxiety disorder, unspecified: Secondary | ICD-10-CM

## 2014-09-26 DIAGNOSIS — F418 Other specified anxiety disorders: Secondary | ICD-10-CM | POA: Diagnosis not present

## 2014-09-26 DIAGNOSIS — G43009 Migraine without aura, not intractable, without status migrainosus: Secondary | ICD-10-CM | POA: Diagnosis present

## 2014-09-26 DIAGNOSIS — M2662 Arthralgia of temporomandibular joint: Secondary | ICD-10-CM | POA: Diagnosis not present

## 2014-09-26 DIAGNOSIS — F329 Major depressive disorder, single episode, unspecified: Secondary | ICD-10-CM | POA: Insufficient documentation

## 2014-09-26 DIAGNOSIS — F32A Depression, unspecified: Secondary | ICD-10-CM

## 2014-09-26 DIAGNOSIS — M26629 Arthralgia of temporomandibular joint, unspecified side: Secondary | ICD-10-CM

## 2014-09-26 HISTORY — DX: Depression, unspecified: F32.A

## 2014-09-26 HISTORY — DX: Anxiety disorder, unspecified: F41.9

## 2014-09-26 MED ORDER — RIZATRIPTAN BENZOATE 10 MG PO TABS
10.0000 mg | ORAL_TABLET | ORAL | Status: DC | PRN
Start: 2014-09-26 — End: 2016-07-24

## 2014-09-26 MED ORDER — CYCLOBENZAPRINE HCL 10 MG PO TABS: 10.0000 mg | ORAL_TABLET | Freq: Three times a day (TID) | ORAL | Status: DC | PRN

## 2014-09-26 NOTE — Progress Notes (Signed)
    Subjective:    Patient ID: Derrick Ortiz is a 44 y.o. male presenting with Ear Pain  on 09/26/2014  HPI: Reports feeling throbbing in his ear. Has progressed to sharp pain for now.  Pain x 5-6 months.  Only on right side. Denies drainage.  Denies trouble hearing.  Denies recent cold or sinus symptoms. Takes tylenol and ibuprofen which is not helping. Reports right sided neck pain as well.pain is worse with bending down. Pain comes and goes. Present with awakening and lasts all day.  Pain is so bad sometimes. On disability. Feels like migraine headaches. Taking Imitrex for migraines but reports this is no longer helping. Reports photophobia and phonophobia. Reports high stress levels. Is a Research officer, [employer].  Review of Systems  Constitutional: Negative for fever and chills.  Respiratory: Negative for shortness of breath.   Cardiovascular: Negative for leg swelling.  Gastrointestinal: Negative for nausea, vomiting and abdominal pain.      Objective:    BP 125/84 mmHg  Pulse 92  Temp(Src) 98.2 F (36.8 C) (Oral)  Ht 5\' 11"  (1.803 m)  Wt 227 lb 6 oz (103.137 kg)  BMI 31.73 kg/m2 Physical Exam  Constitutional: He appears well-developed and well-nourished. No distress.  HENT:  Head: Normocephalic and atraumatic.  Eyes: No scleral icterus.  Neck: Neck supple.  Cardiovascular: Normal rate.   Pulmonary/Chest: Effort normal.  Abdominal: Soft.  Musculoskeletal: He exhibits no edema.  Neurological: He is alert.  Skin: Skin is warm.  Psychiatric: He has a normal mood and affect.  Vitals reviewed.       Assessment & Plan:   Problem List Items Addressed This Visit      Unprioritized   Migraine without aura - Primary    Change from Imitrex to Maxalt.      Relevant Medications   rizatriptan (MAXALT) 10 MG tablet   cyclobenzaprine (FLEXERIL) 10 MG tablet   TMJ arthralgia    Likely correct diagnosis, given history.  Trial of Flexeril.  Advised to get mouth guard from drug store.   Formal dental referral if no improvement.      Anxiety and depression    Needs adjustment of his medication.  To f/u with [facility] who give him his Abilify.          Return if symptoms worsen or fail to improve.  Derrick Ortiz S 09/26/2014 4:19 PM

## 2014-09-26 NOTE — Patient Instructions (Signed)
Temporomandibular Problems  Temporomandibular joint (TMJ) dysfunction means there are problems with the joint between your jaw and your skull. This is a joint lined by cartilage like other joints in your body but also has a small disc in the joint which keeps the bones from rubbing on each other. These joints are like other joints and can get inflamed (sore) from arthritis and other problems. When this joint gets sore, it can cause headaches and pain in the jaw and the face. CAUSES  Usually the arthritic types of problems are caused by soreness in the joint. Soreness in the joint can also be caused by overuse. This may come from grinding your teeth. It may also come from mis-alignment in the joint. DIAGNOSIS Diagnosis of this condition can often be made by history and exam. Sometimes your caregiver may need X-rays or an MRI scan to determine the exact cause. It may be necessary to see your dentist to determine if your teeth and jaws are lined up correctly. TREATMENT  Most of the time this problem is not serious; however, sometimes it can persist (become chronic). When this happens medications that will cut down on inflammation (soreness) help. Sometimes a shot of cortisone into the joint will be helpful. If your teeth are not aligned it may help for your dentist to make a splint for your mouth that can help this problem. If no physical problems can be found, the problem may come from tension. If tension is found to be the cause, biofeedback or relaxation techniques may be helpful. HOME CARE INSTRUCTIONS   Later in the day, applications of ice packs may be helpful. Ice can be used in a plastic bag with a towel around it to prevent frostbite to skin. This may be used about every 2 hours for 20 to 30 minutes, as needed while awake, or as directed by your caregiver.  Only take over-the-counter or prescription medicines for pain, discomfort, or fever as directed by your caregiver.  If physical therapy was  prescribed, follow your caregiver's directions.  Wear mouth appliances as directed if they were given. Document Released: 02/11/2001 Document Revised: 08/11/2011 Document Reviewed: 05/21/2008 [employer] Patient Information 2015 [employer]. This information is not intended to replace advice given to you by your health care provider. Make sure you discuss any questions you have with your health care provider.  

## 2014-09-26 NOTE — Assessment & Plan Note (Signed)
Likely correct diagnosis, given history.  Trial of Flexeril.  Advised to get mouth guard from drug store.  Formal dental referral if no improvement.

## 2014-09-26 NOTE — Assessment & Plan Note (Signed)
Needs adjustment of his medication.  To f/u with [facility] who give him his Abilify.

## 2014-09-26 NOTE — Assessment & Plan Note (Signed)
Change from Imitrex to Maxalt.

## 2014-12-01 IMAGING — US US ABDOMEN COMPLETE
1 series · 13 of 25 positions shown · non-contrast
Comparison: CT scan of December 23, 2012.

CLINICAL DATA: Abdominal pain.

ABDOMINAL ULTRASOUND COMPLETE

[Series 1: us abdomen complete · 0.28mm/px · 13 of 70 slices shown]
[im 1/70]
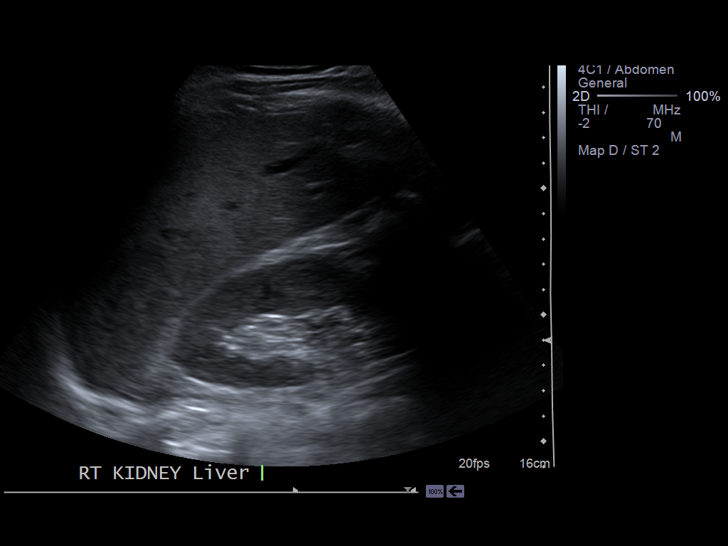
[im 6/70]
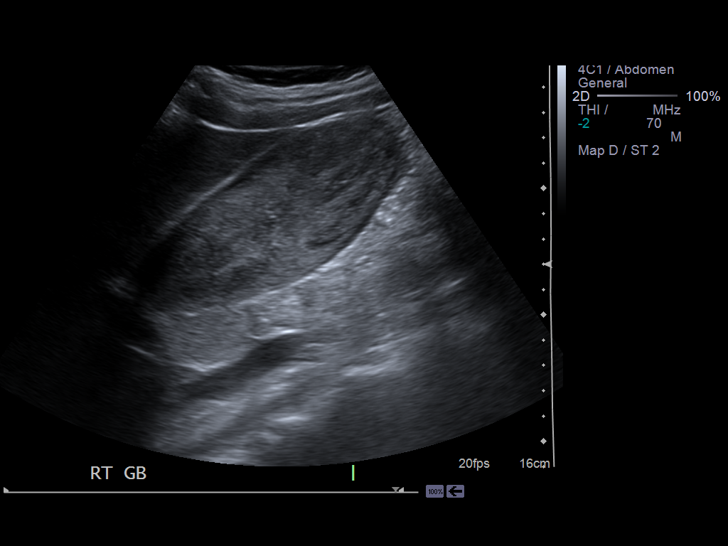
[im 12/70]
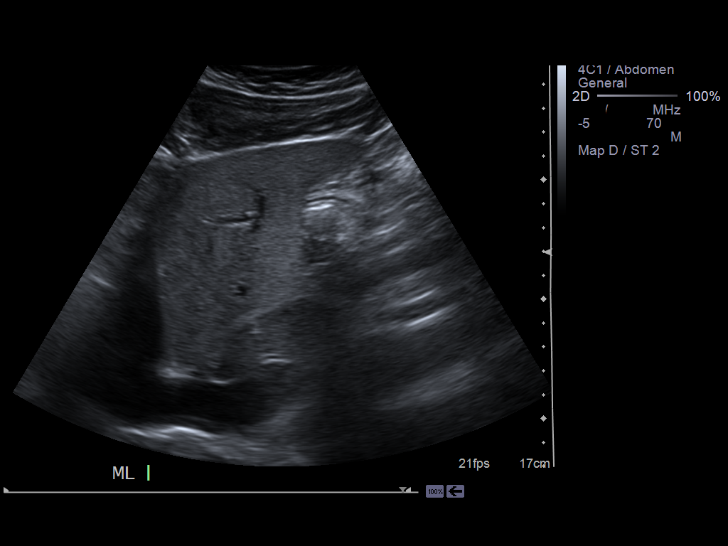
[im 18/70]
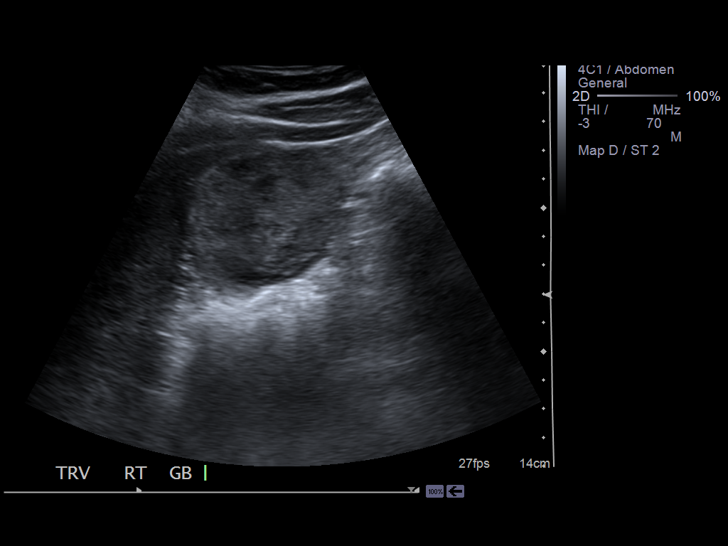
[im 24/70]
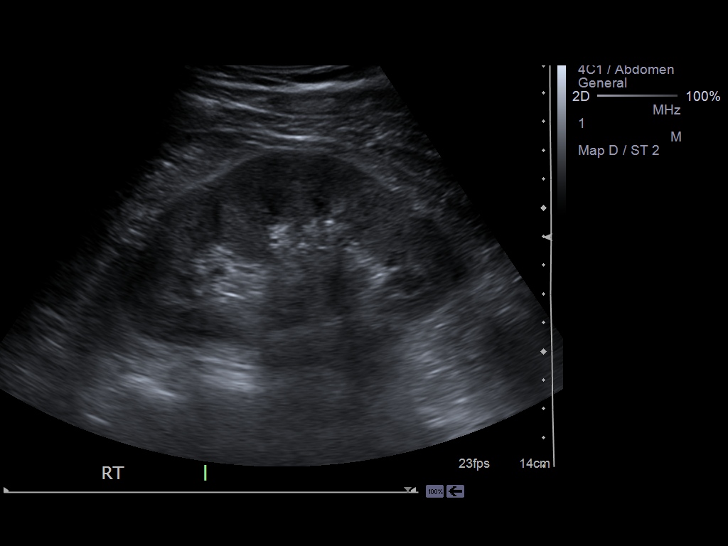
[im 29/70]
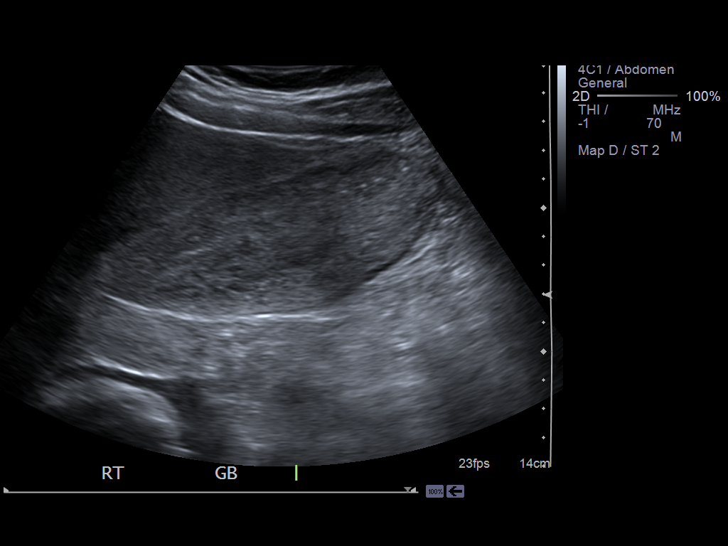
[im 35/70]
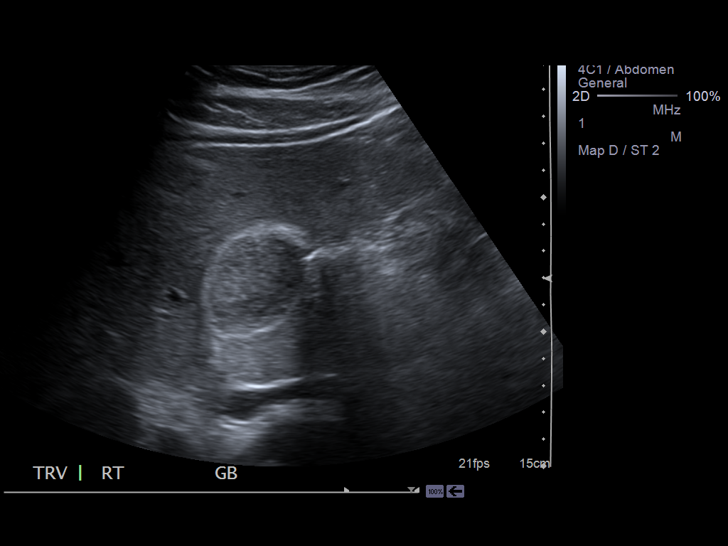
[im 41/70]
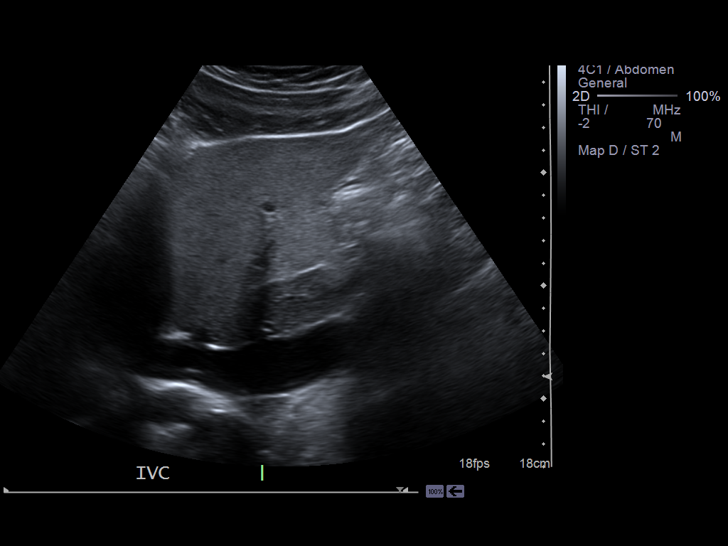
[im 47/70]
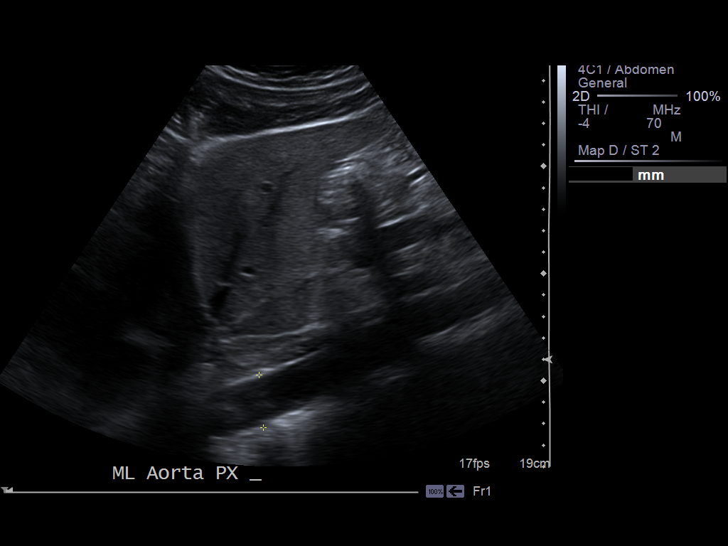
[im 52/70]
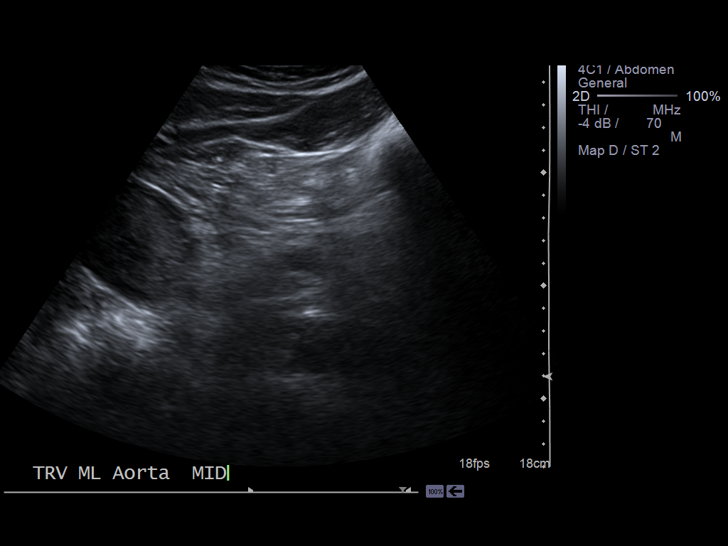
[im 58/70]
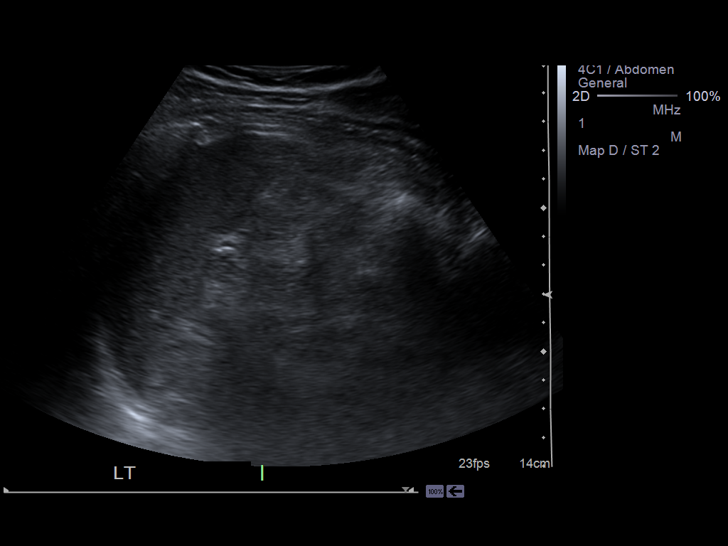
[im 64/70]
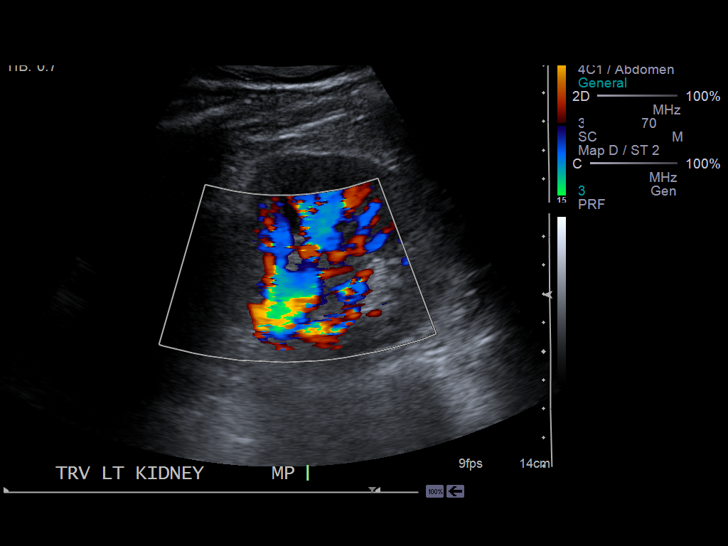
[im 70/70]
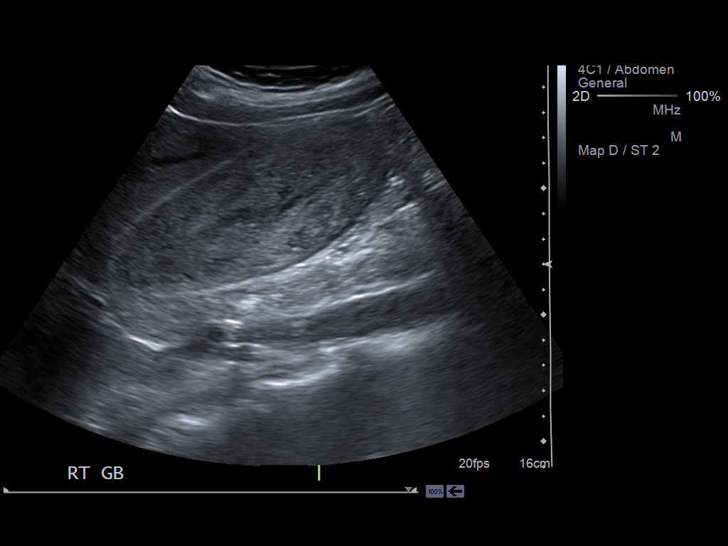

[13 of 25 positions shown; findings below may reference images not displayed]

FINDINGS: Gallbladder:  No gallstones are noted.  However, gallbladder is
distended both mildly echogenic material which may simply represent
sludge.  No significant gallbladder wall thickening is noted.
Minimal pericholecystic fluid is noted.  No sonographic Murphy's
sign is noted.

Common Bile Duct:  Measures 5.2 mm which is within normal limits in
caliber.

Liver: No focal mass lesion identified.  Within normal limits in
parenchymal echogenicity.

IVC:  Appears normal.

Pancreas:  No abnormality identified.

Spleen:  Within normal limits in size and echotexture.

Right kidney:  Measures 11.9 cm in length. Normal in size and
parenchymal echogenicity.  No evidence of mass or hydronephrosis.

Left kidney:  Measures 11.7 cm in length. Normal in size and
parenchymal echogenicity.  No evidence of mass or hydronephrosis.

Abdominal Aorta:  No aneurysm identified.
IMPRESSION: Gallbladder distension is noted and gallbladder lumen is filled
with echogenic material which most likely represents tumefactive
sludge.  Minimal pericholecystic fluid is noted; if the patient is
symptomatic in this area, some degree of cholecystitis may be
considered.

## 2015-07-26 ENCOUNTER — Other Ambulatory Visit: Payer: Self-pay | Admitting: Family Medicine

## 2015-07-26 ENCOUNTER — Encounter: Payer: Medicaid Other | Admitting: Family Medicine

## 2015-08-13 ENCOUNTER — Ambulatory Visit (HOSPITAL_COMMUNITY)
Admission: RE | Admit: 2015-08-13 | Discharge: 2015-08-13 | Disposition: A | Discharge status: Home / Self Care | Payer: Medicaid Other | Source: Ambulatory Visit | Attending: Family Medicine | Admitting: Family Medicine

## 2015-08-13 ENCOUNTER — Encounter: Payer: Self-pay | Admitting: Student

## 2015-08-13 ENCOUNTER — Ambulatory Visit (INDEPENDENT_AMBULATORY_CARE_PROVIDER_SITE_OTHER): Payer: Medicaid Other | Admitting: Student

## 2015-08-13 ENCOUNTER — Ambulatory Visit: Payer: Medicaid Other | Admitting: Family Medicine

## 2015-08-13 VITALS — BP 140/90 | HR 96 | Temp 98.6°F | Wt 224.0 lb

## 2015-08-13 DIAGNOSIS — R109 Unspecified abdominal pain: Secondary | ICD-10-CM | POA: Insufficient documentation

## 2015-08-13 DIAGNOSIS — R1084 Generalized abdominal pain: Secondary | ICD-10-CM

## 2015-08-13 DIAGNOSIS — R110 Nausea: Secondary | ICD-10-CM | POA: Diagnosis not present

## 2015-08-13 MED ORDER — DOCUSATE SODIUM 100 MG PO CAPS: 100.0000 mg | ORAL_CAPSULE | Freq: Two times a day (BID) | ORAL | Status: DC

## 2015-08-13 MED ORDER — ONDANSETRON HCL 4 MG PO TABS: 4.0000 mg | ORAL_TABLET | Freq: Three times a day (TID) | ORAL | Status: DC | PRN

## 2015-08-13 NOTE — Assessment & Plan Note (Addendum)
Abdominal pain with nausea , abdominal distention in setting of the history of abdominal surgery concerning for potential ileus versus partial obstruction.  Gastroenteritis possible however with only induced emesis and no diarrhea less likely. Constipation also possible given narcotic pain meds with no bowel regimen.  - will  obtain flatplate abdominal imaging -  Zofran for nausea - Colace prescribed -  will follow closely

## 2015-08-13 NOTE — Progress Notes (Signed)
   Subjective:    Patient ID: Derrick Ortiz, male    DOB: 1970/11/17, 45 y.o.   MRN: [id number]   CC: nausea, abdominal cramping  HPI:  45 year old male presenting for nausea for a week abdominal cramping   Nausea/abdominal Cramping -  Started 1 week ago. -   Made himself vomit yesterday, otherwise has not thrown up - reports worsening nausea after eating -  Denies diarrhea and reports daily hard bowel movements -  When his nausea and abdominal cramping started, he reports that he had abdominal distention,  Which has partially  gone down -  He denies ever having anything like this before -  His history of hernia , status post repair and mesh placement - denies genital lesions, dysuria - he is sexually active but with his wife only  Review of Systems ROS  per the history of present illness otherwise he denies recent fevers, illnesses, headache, changes in vision Past Medical, Surgical, Social, and Family History Reviewed & Updated per EMR.   Objective:  BP 140/90 mmHg  Pulse 96  Temp(Src) 98.6 F (37 C) (Oral)  Wt 224 lb (101.606 kg) Vitals and nursing note reviewed  General: NAD Cardiac: RRR, Respiratory: CTAB, normal effort Abdomen: soft, mildly tender to palpation most over the lower abdomen, + distension, + high pitched bowel sounds   Extremities: no edema or cyanosis. WWP. Neuro: alert and oriented, no focal deficits   Assessment & Plan:    Abdominal pain  Abdominal pain with nausea , abdominal distention in setting of the history of abdominal surgery concerning for potential ileus versus partial obstruction.  Gastroenteritis possible however with only induced emesis and no diarrhea less likely. Constipation also possible given narcotic pain meds with no bowel regimen.  - will  obtain flatplate abdominal imaging -  Zofran for nausea - Colace prescribed -  will follow closely     Alyssa A. Lincoln Brigham MD, [facility] Resident PGY-2 Pager [phone]

## 2015-08-13 NOTE — Patient Instructions (Signed)
Follow up with PCP in 1 week If you have questions or concerns, call the office at [phone]

## 2015-08-14 ENCOUNTER — Telehealth: Payer: Self-pay | Admitting: Student

## 2015-08-14 NOTE — Telephone Encounter (Signed)
Pt called regarding his abdominal xray. It was negative for bowel obstruction but did show moderate stool in his colon. Patient was encouraged to take colace to soften his bowels and help with his constipation  Alyssa A. Lincoln Brigham MD, [facility] Resident PGY-2 Pager [phone]

## 2015-08-16 ENCOUNTER — Encounter (HOSPITAL_COMMUNITY): Payer: Self-pay | Admitting: Emergency Medicine

## 2015-08-16 ENCOUNTER — Emergency Department (INDEPENDENT_AMBULATORY_CARE_PROVIDER_SITE_OTHER)
Admission: EM | Admit: 2015-08-16 | Discharge: 2015-08-16 | Disposition: A | Discharge status: Home / Self Care | Payer: Medicaid Other | Source: Home / Self Care | Attending: Family Medicine | Admitting: Family Medicine

## 2015-08-16 DIAGNOSIS — K2970 Gastritis, unspecified, without bleeding: Secondary | ICD-10-CM | POA: Diagnosis not present

## 2015-08-16 DIAGNOSIS — R112 Nausea with vomiting, unspecified: Secondary | ICD-10-CM

## 2015-08-16 NOTE — Discharge Instructions (Signed)

## 2015-08-16 NOTE — ED Notes (Signed)
See [facility] notation

## 2015-08-17 NOTE — ED Provider Notes (Signed)
CSN: [id number]     Arrival date & time 08/16/15  1549 History   First MD Initiated Contact with Patient 08/16/15 1708     Chief Complaint  Patient presents with  . Nausea   (Consider location/radiation/quality/duration/timing/severity/associated sxs/prior Treatment) HPI History obtained from patient:  Pt presents with the cc [id number] and vomiting Duration of symptoms:4 days Treatment prior to arrival:zofran Context:was seen and treated for gatritis Other symptoms include:occasional loose stool Pain score:1   Past Medical History  Diagnosis Date  . GERD (gastroesophageal reflux disease)   . Headache(784.0)   . Hx of migraines     takes Propranolol and Imitrex prn migraines  . Arthritis   . History of hemorrhoids 08/09/2009    Qualifier: Diagnosis of  By: McDiarmid MD, Sherren Mocha    . HEMORRHOIDS, EXTERNAL 08/09/2009    Qualifier: Diagnosis of  By: McDiarmid MD, Sherren Mocha    . Vitamin D deficiency 09/27/2013   Past Surgical History  Procedure Laterality Date  . Femoral artery repair  1996    S/P Femoral-popliteal artery repair (Dr. Truman Hayward)  after traumatic injury in 1996  . Orif femur fracture  1996  . Inguinal hernia repair  2008    [facility]  . Hernia repair  2008    ventral hernia repair  . Cystoscopy  02/08/2013    Dr B. [facility])  . Cholecystectomy N/A 03/23/2013    LAPAROSCOPIC CHOLECYSTECTOMY;  Surgeon: Stark Klein, MD Pathology showed chronic cholecystitis and choleliths   Family History  Problem Relation Age of Onset  . Diabetes type II Father   . Hypertension Father    Social History  Substance Use Topics  . Smoking status: Current Every Day Smoker -- 0.25 packs/day  . Smokeless tobacco: Never Used  . Alcohol Use: No    Review of Systems ROS +'ve nausea  Denies: HEADACHE, CHEST PAIN, CONGESTION, DYSURIA, SHORTNESS OF BREATH  Allergies  Review of patient's allergies indicates no known allergies.  Home Medications   Prior to  Admission medications   Medication Sig Start Date End Date Taking? Authorizing Provider  ARIPiprazole (ABILIFY) 10 MG tablet Take 1 tablet (10 mg total) by mouth daily. 06/20/13   Amber Fidel Levy, MD  cyclobenzaprine (FLEXERIL) 10 MG tablet Take 1 tablet (10 mg total) by mouth 3 (three) times daily as needed for muscle spasms. 09/26/14   Donnamae Jude, MD  docusate sodium (COLACE) 100 MG capsule Take 1 capsule (100 mg total) by mouth 2 (two) times daily. 08/13/15   Veatrice Bourbon, MD  HYDROcodone-acetaminophen (NORCO/VICODIN) 5-325 MG tablet TK 1-2 TS PO Q 4-6 H PRN P 06/25/15   Historical Provider, MD  naproxen (NAPROSYN) 500 MG tablet Take 1 tablet (500 mg total) by mouth 2 (two) times daily with a meal. 06/20/13   Amber Fidel Levy, MD  omeprazole (PRILOSEC) 20 MG capsule Take 1 capsule (20 mg total) by mouth at bedtime. 06/20/13   Amber Fidel Levy, MD  ondansetron (ZOFRAN) 4 MG tablet Take 1 tablet (4 mg total) by mouth every 8 (eight) hours as needed for nausea or vomiting. 08/13/15   Veatrice Bourbon, MD  propranolol ER (INDERAL LA) 80 MG 24 hr capsule Take 1 capsule (80 mg total) by mouth daily. Migraine prevention 06/20/13   Montez Morita, MD  rizatriptan (MAXALT) 10 MG tablet Take 1 tablet (10 mg total) by mouth as needed for migraine. May repeat in 2 hours if needed 09/26/14   Donnamae Jude, MD  Vitamin D, Ergocalciferol, (DRISDOL) 50000 UNITS CAPS capsule Take 1 capsule (50,000 Units total) by mouth every 7 (seven) days. For 12 weeks, then once a month. 09/27/13   Blane Ohara McDiarmid, MD   Meds Ordered and Administered this Visit  Medications - No data to display  BP 143/96 mmHg  Pulse 95  Temp(Src) 98.7 F (37.1 C) (Oral)  Resp 16  SpO2 97% No data found.   Physical Exam NURSES NOTES AND VITAL SIGNS REVIEWED. CONSTITUTIONAL: Well developed, well nourished, no acute distress HEENT: normocephalic, atraumatic, right and left TM's are normal EYES: Conjunctiva normal NECK:normal ROM, supple,  no adenopathy PULMONARY:No respiratory distress, normal effort, Lungs: CTAb/l, no wheezes, or increased work of breathing CARDIOVASCULAR: RRR, no murmur ABDOMEN: soft, ND, NT, +'ve BS MUSCULOSKELETAL: Normal ROM of all extremities,  SKIN: warm and dry without rash PSYCHIATRIC: Mood and affect, behavior are normal  ED Course  Procedures (including critical care time)  Labs Review Labs Reviewed - No data to display  Imaging Review No results found.   Visual Acuity Review  Right Eye Distance:   Left Eye Distance:   Bilateral Distance:    Right Eye Near:   Left Eye Near:    Bilateral Near:       Discussed treatment options including changing medications Sipping fluids etc  MDM   1. Gastritis   2. Non-intractable vomiting with nausea, vomiting of unspecified type     Patient is reassured that there are no issues that require transfer to higher level of care at this time or additional tests. Patient is advised to continue home symptomatic treatment. Patient is advised that if there are new or worsening symptoms to attend the emergency department, contact primary care provider, or return to UC. Instructions of care provided discharged home in stable condition. Return to work/school note provided.   THIS NOTE WAS GENERATED USING A VOICE RECOGNITION SOFTWARE PROGRAM. ALL REASONABLE EFFORTS  WERE MADE TO PROOFREAD THIS DOCUMENT FOR ACCURACY.  I have verbally reviewed the discharge instructions with the patient. A printed AVS was given to the patient.  All questions were answered prior to discharge.      Konrad Felix, [place] 08/17/15 [phone]

## 2015-08-23 ENCOUNTER — Ambulatory Visit (INDEPENDENT_AMBULATORY_CARE_PROVIDER_SITE_OTHER): Payer: Medicaid Other | Admitting: Family Medicine

## 2015-08-23 ENCOUNTER — Encounter: Payer: Self-pay | Admitting: Family Medicine

## 2015-08-23 VITALS — BP 147/97 | HR 83 | Temp 98.3°F | Wt 222.0 lb

## 2015-08-23 DIAGNOSIS — R030 Elevated blood-pressure reading, without diagnosis of hypertension: Secondary | ICD-10-CM

## 2015-08-23 DIAGNOSIS — I10 Essential (primary) hypertension: Secondary | ICD-10-CM

## 2015-08-23 DIAGNOSIS — IMO0001 Reserved for inherently not codable concepts without codable children: Secondary | ICD-10-CM

## 2015-08-23 DIAGNOSIS — R1033 Periumbilical pain: Secondary | ICD-10-CM | POA: Diagnosis not present

## 2015-08-23 HISTORY — DX: Essential (primary) hypertension: I10

## 2015-08-23 LAB — CBC WITH DIFFERENTIAL/PLATELET
BASOS ABS: 0 10*3/uL (ref 0.0–0.1)
Basophils Relative: 0 % (ref 0–1)
EOS ABS: 0.1 10*3/uL (ref 0.0–0.7)
Eosinophils Relative: 2 % (ref 0–5)
HCT: 41.6 % (ref 39.0–52.0)
Hemoglobin: 14.5 g/dL (ref 13.0–17.0)
LYMPHS ABS: 2.7 10*3/uL (ref 0.7–4.0)
LYMPHS PCT: 52 % — AB (ref 12–46)
MCH: 31.1 pg (ref 26.0–34.0)
MCHC: 34.9 g/dL (ref 30.0–36.0)
MCV: 89.3 fL (ref 78.0–100.0)
MPV: 9.4 fL (ref 8.6–12.4)
Monocytes Absolute: 0.5 10*3/uL (ref 0.1–1.0)
Monocytes Relative: 10 % (ref 3–12)
NEUTROS PCT: 36 % — AB (ref 43–77)
Neutro Abs: 1.8 10*3/uL (ref 1.7–7.7)
PLATELETS: 272 10*3/uL (ref 150–400)
RBC: 4.66 MIL/uL (ref 4.22–5.81)
RDW: 14.2 % (ref 11.5–15.5)
WBC: 5.1 10*3/uL (ref 4.0–10.5)

## 2015-08-23 MED ORDER — OMEPRAZOLE 20 MG PO CPDR: 20.0000 mg | DELAYED_RELEASE_CAPSULE | Freq: Every day | ORAL | Status: DC

## 2015-08-23 NOTE — Assessment & Plan Note (Signed)
No prior history of hypertension. BP elevation may be secondary to currently illnes. Will recheck BP when illness resolved.

## 2015-08-23 NOTE — Progress Notes (Signed)
   Subjective:    Patient ID: Derrick Ortiz, male    DOB: 1970/06/28, 45 y.o.   MRN: [id number] Derrick Ortiz is alone Sources of clinical information for visit is/are patient and past medical records. Nursing assessment for this office visit was reviewed with the patient for accuracy and revision.   HPI  ABDOMINAL PAIN  Location: around navel and bilateral lower abdomin.   Onset: 08/13/15 morning  Radiation: none  Severity: at peak 10/10 cramping pain with incapacitation Quality: ache and cramping Pattern: low grade constant with intermittent episodes of worsening Course: gradually improving since 08/20/15.  Better with: nothing  Worse with: eating or drinking Patient evlauated by Dr A. Haney at [facility] for Nausea and abdominal crampiing on 08/13/15, dx with VGE and treeated with Zofran.  Pt unable to afford copay for Zofran, so did not get it.  Patient evaluated by Dr Juventino Slovak at [facility] on 08/16/15 with N/V and dx with gastritis and tx recommended was symptomatic care.       Symptoms Nausea/Vomiting: yes, has been unable to keep down solids for 10 days.  Diarrhea: no  Constipation: yes, 3/20 was first BM for a week.    Melena/BRBPR: Initially stools were black on 08/20/15 but now are brown. No visible blood. Stools were soft, no liquid stools.   Hematemesis: no  Anorexia: yes, for about one week, appetite has started to return  Fever/Chills: yes, pt reports hot & cold experiences  Dysuria: no  Rash: no  Wt loss: no  EtOH use: no, only drinks occasionally.    NSAIDs/ASA: no  STD risk/hx: no, mutually monogamous marriage   Past Surgeries: Ventral hernia repair with mesh 2008 - [facility]                            Inguinal hernia repair 2008 [facility]                            Lap. Cholecystectomy 2014 - [facility]      Femoral Artery repair as teenager.   (+) Smoking  Medication list: Abilify.  Was taking omeprazole until 3  days abo when he ran out of it.   Denies NSAID or cold remedy OTCs use.   Review of Systems  See HPI     Objective:   Physical Exam VS reviewed GEN: Alert, Cooperative, Groomed, NAD HEENT: PERRL, no jaundice COR: RRR, No M/G/R, Normal PMI size and location LUNGS: BCTA, No Acc mm use, speaking in full sentences ABDOMEN: slight fullness ~ 1 inch diameter,  just to left of ventral hernia surgical scar and just superior to umbilicus, mildly tender to palpation at this fullness, no increase in size of fullness with valsalva, no overlying erythema.  (+)BS, soft, NT, No HSM, no inguinal LAN GU: Normal Rectal tone, no palpable masses, prostate without hypertrophy/asymmetry/nodularity. Hemoccult negative. EXT: No peripheral leg edema.  SKIN: No lesion nor rashes of face/trunk/extremities Gait: Normal speed, No significant path deviation, Step through +,  Psych: Normal affect/thought/speech/language    Assessment & Plan:

## 2015-08-23 NOTE — Assessment & Plan Note (Addendum)
New problem Further workup planned. Past recent history of Vomiting, constipation, cramping pain localized to ventral hernia repair surgical scar with possible palpable fullness in same periumbilical / ventral hernia scar region raises concern for a recurrent ventral hernia that may have been temporarily incarcerated.   Given that patient is still having post-prandial vomiting to solids, will proceed with Abd-pelvic CT with oral and IV contrast. Draw CBC/CMET/Lipase.  Pt appears well hydrated currently.  Pt instructed to proceed to ED if abdominal pain or inability to keep down liquids returns.  Derrick Ortiz has been able to keep down Gatorade without difficulty for last two days.

## 2015-08-23 NOTE — Patient Instructions (Signed)
    Ventral Hernia A ventral hernia (also called an incisional hernia) is a hernia that occurs at the site of a previous surgical cut (incision) in the abdomen. The abdominal wall spans from your lower chest down to your pelvis. If the abdominal wall is weakened from a surgical incision, a hernia can occur. A hernia is a bulge of bowel or muscle tissue pushing out on the weakened part of the abdominal wall. Ventral hernias can get bigger from straining or lifting. Obese and older people are at higher risk for a ventral hernia. People who develop infections after surgery or require repeat incisions at the same site on the abdomen are also at increased risk. CAUSES  A ventral hernia occurs because of weakness in the abdominal wall at an incision site.  SYMPTOMS  Common symptoms include:  A visible bulge or lump on the abdominal wall.  Pain or tenderness around the lump.  Increased discomfort if you cough or make a sudden movement. If the hernia has blocked part of the intestine, a serious complication can occur (incarcerated or strangulated hernia). This can become a problem that requires emergency surgery because the blood flow to the blocked intestine may be cut off. Symptoms may include:  Feeling sick to your stomach (nauseous).  Throwing up (vomiting).  Stomach swelling (distention) or bloating.  Fever.  Rapid heartbeat. DIAGNOSIS  Your health care provider will take a medical history and perform a physical exam. Various tests may be ordered, such as:  Blood tests.  Urine tests.  Ultrasonography.  X-rays.  Computed tomography (CT). TREATMENT  Watchful waiting may be all that is needed for a smaller hernia that does not cause symptoms. Your health care provider may recommend the use of a supportive belt (truss) that helps to keep the abdominal wall intact. For larger hernias or those that cause pain, surgery to repair the hernia is usually recommended. If a hernia becomes  strangulated, emergency surgery needs to be done right away. HOME CARE INSTRUCTIONS  Avoid putting pressure or strain on the abdominal area.  Avoid heavy lifting.  Use good body positioning for physical tasks. Ask your health care provider about proper body positioning.  Use a supportive belt as directed by your health care provider.  Maintain a healthy weight.  Eat foods that are high in fiber, such as whole grains, fruits, and vegetables. Fiber helps prevent difficult bowel movements (constipation).  Drink enough fluids to keep your urine clear or pale yellow.  Follow up with your health care provider as directed. SEEK MEDICAL CARE IF:   Your hernia seems to be getting larger or more painful. SEEK IMMEDIATE MEDICAL CARE IF:   You have abdominal pain that is sudden and sharp.  Your pain becomes severe.  You have repeated vomiting.  You are sweating a lot.  You notice a rapid heartbeat.  You develop a fever. MAKE SURE YOU:   Understand these instructions.  Will watch your condition.  Will get help right away if you are not doing well or get worse.   This information is not intended to replace advice given to you by your health care provider. Make sure you discuss any questions you have with your health care provider.

## 2015-08-24 LAB — COMPLETE METABOLIC PANEL WITH GFR
ALT: 19 U/L (ref 9–46)
AST: 17 U/L (ref 10–40)
Albumin: 3.8 g/dL (ref 3.6–5.1)
Alkaline Phosphatase: 78 U/L (ref 40–115)
BILIRUBIN TOTAL: 0.5 mg/dL (ref 0.2–1.2)
BUN: 8 mg/dL (ref 7–25)
CALCIUM: 9.2 mg/dL (ref 8.6–10.3)
CO2: 24 mmol/L (ref 20–31)
CREATININE: 0.76 mg/dL (ref 0.60–1.35)
Chloride: 107 mmol/L (ref 98–110)
GFR, Est Non African American: >89 mL/min (ref >=60)
Glucose, Bld: 81 mg/dL (ref 65–99)
Potassium: 4.3 mmol/L (ref 3.5–5.3)
Sodium: 140 mmol/L (ref 135–146)
TOTAL PROTEIN: 6.7 g/dL (ref 6.1–8.1)

## 2015-08-24 LAB — LIPASE: LIPASE: 20 U/L (ref 7–60)

## 2015-08-31 ENCOUNTER — Ambulatory Visit
Admission: RE | Admit: 2015-08-31 | Discharge: 2015-08-31 | Disposition: A | Discharge status: Home / Self Care | Payer: Medicaid Other | Source: Ambulatory Visit | Attending: Family Medicine | Admitting: Family Medicine

## 2015-08-31 ENCOUNTER — Telehealth: Payer: Self-pay | Admitting: Family Medicine

## 2015-08-31 DIAGNOSIS — R1033 Periumbilical pain: Secondary | ICD-10-CM

## 2015-08-31 MED ORDER — IOPAMIDOL (ISOVUE-300) INJECTION 61%
125.0000 mL | Freq: Once | INTRAVENOUS | Status: AC | PRN
  Administered 2015-08-31: 125 mL via INTRAVENOUS

## 2015-08-31 NOTE — Telephone Encounter (Signed)
I spoke with Derrick Ortiz.  He is feeling better.  He is eating better. The pain around his navel is resolved.   We discussed the relatively unremarkable CT of his abdomin and pelvis There may be some scar tissue in the region where he was having pain near his umbilicus that may have contributed to the pain.   Rex is to contact our office should the abdominal pain return.

## 2015-10-16 ENCOUNTER — Emergency Department (HOSPITAL_COMMUNITY)
Admission: EM | Admit: 2015-10-16 | Discharge: 2015-10-16 | Disposition: A | Discharge status: Home / Self Care | Payer: Medicaid Other | Attending: Emergency Medicine | Admitting: Emergency Medicine

## 2015-10-16 ENCOUNTER — Encounter (HOSPITAL_COMMUNITY): Payer: Self-pay

## 2015-10-16 ENCOUNTER — Emergency Department (HOSPITAL_COMMUNITY): Payer: Medicaid Other

## 2015-10-16 DIAGNOSIS — Z79899 Other long term (current) drug therapy: Secondary | ICD-10-CM | POA: Insufficient documentation

## 2015-10-16 DIAGNOSIS — Y998 Other external cause status: Secondary | ICD-10-CM | POA: Diagnosis not present

## 2015-10-16 DIAGNOSIS — S93401A Sprain of unspecified ligament of right ankle, initial encounter: Secondary | ICD-10-CM | POA: Insufficient documentation

## 2015-10-16 DIAGNOSIS — K219 Gastro-esophageal reflux disease without esophagitis: Secondary | ICD-10-CM | POA: Insufficient documentation

## 2015-10-16 DIAGNOSIS — Y9289 Other specified places as the place of occurrence of the external cause: Secondary | ICD-10-CM | POA: Diagnosis not present

## 2015-10-16 DIAGNOSIS — W1789XA Other fall from one level to another, initial encounter: Secondary | ICD-10-CM | POA: Insufficient documentation

## 2015-10-16 DIAGNOSIS — S99911A Unspecified injury of right ankle, initial encounter: Secondary | ICD-10-CM | POA: Diagnosis present

## 2015-10-16 DIAGNOSIS — F329 Major depressive disorder, single episode, unspecified: Secondary | ICD-10-CM | POA: Diagnosis not present

## 2015-10-16 DIAGNOSIS — M1990 Unspecified osteoarthritis, unspecified site: Secondary | ICD-10-CM | POA: Diagnosis not present

## 2015-10-16 DIAGNOSIS — G43909 Migraine, unspecified, not intractable, without status migrainosus: Secondary | ICD-10-CM | POA: Insufficient documentation

## 2015-10-16 DIAGNOSIS — S82401A Unspecified fracture of shaft of right fibula, initial encounter for closed fracture: Secondary | ICD-10-CM | POA: Insufficient documentation

## 2015-10-16 DIAGNOSIS — Y9389 Activity, other specified: Secondary | ICD-10-CM | POA: Insufficient documentation

## 2015-10-16 DIAGNOSIS — Z8639 Personal history of other endocrine, nutritional and metabolic disease: Secondary | ICD-10-CM | POA: Insufficient documentation

## 2015-10-16 DIAGNOSIS — F17200 Nicotine dependence, unspecified, uncomplicated: Secondary | ICD-10-CM | POA: Diagnosis not present

## 2015-10-16 MED ORDER — IBUPROFEN 600 MG PO TABS: 600.0000 mg | ORAL_TABLET | Freq: Four times a day (QID) | ORAL | Status: DC | PRN

## 2015-10-16 MED ORDER — OXYCODONE HCL 5 MG PO TABS: 5.0000 mg | ORAL_TABLET | Freq: Four times a day (QID) | ORAL | Status: DC | PRN

## 2015-10-16 NOTE — ED Notes (Signed)
Ice pack applied to right ankle.

## 2015-10-16 NOTE — ED Provider Notes (Signed)
CSN: [id number]     Arrival date & time 10/16/15  1754 History  By signing my name below, I, Derrick Ortiz, attest that this documentation has been prepared under the direction and in the presence of non-physician practitioner, Harlene Ramus, PA-C. Electronically Signed: [name], Scribe. 10/16/2015. 6:14 PM.   Chief Complaint  Patient presents with  . Ankle Injury    The history is provided by the patient. No language interpreter was used.     HPI Comments: Derrick Ortiz is a 45 y.o. male who presents to the Emergency Department complaining of outer right ankle pain and swelling s/p falling from a height of 5 feet approximately 2 hours ago. Pt states that he fell off of the steps of his porch and inverted his right ankle when he landed. He did not hit his head or lose consciousness. Pt has not applied ice to his right ankle. He took Aleve shortly PTA. He denies right lower leg pain, right foot pain, numbness, or any other associated symptoms.   Past Medical History  Diagnosis Date  . GERD (gastroesophageal reflux disease)   . Headache(784.0)   . Hx of migraines     takes Propranolol and Imitrex prn migraines  . Arthritis   . History of hemorrhoids 08/09/2009    Qualifier: Diagnosis of  By: Derrick Ortiz    . HEMORRHOIDS, EXTERNAL 08/09/2009    Qualifier: Diagnosis of  By: Derrick Ortiz    . Vitamin D deficiency 09/27/2013  . TMJ arthralgia 05/16/2014  . Sexual dysfunction 06/21/2013  . Biliary dyskinesia 03/18/2013  . Anxiety and depression 09/26/2014  . Acute right flank pain 09/22/2013  . Hematuria 01/17/2013    Hematuria x2 on UAs w/ flank/groin pain, but neg CT. Benign vs stones vs other process Smoker concerning for bladder pathology. Urine cytology atypical reactive urothelial cells. Urology consultation 02/08/13 (Dr B. Herrick): Cystoscopy showed erythematous patch on bladder mucosa at trigon. Bx sent .   Rx low-dose viagra.   Urology suspected flank pain is  musculoskeletal. Recommended Physical Therapy.        Past Surgical History  Procedure Laterality Date  . Femoral artery repair  1996    S/P Femoral-popliteal artery repair (Dr. Truman Hayward)  after traumatic injury in 1996  . Orif femur fracture  1996  . Inguinal hernia repair  2008    [facility]  . Hernia repair  2008    ventral hernia repair  . Cystoscopy  02/08/2013    Dr B. [facility])  . Cholecystectomy N/A 03/23/2013    LAPAROSCOPIC CHOLECYSTECTOMY;  Surgeon: Stark Klein, MD Pathology showed chronic cholecystitis and choleliths   Family History  Problem Relation Age of Onset  . Diabetes type II Father   . Hypertension Father    Social History  Substance Use Topics  . Smoking status: Current Every Day Smoker -- 0.25 packs/day  . Smokeless tobacco: Never Used  . Alcohol Use: No    Review of Systems  Musculoskeletal: Positive for arthralgias.  Skin: Positive for wound.  Neurological: Negative for numbness.   Allergies  Review of patient's allergies indicates no known allergies.  Home Medications   Prior to Admission medications   Medication Sig Start Date End Date Taking? Authorizing Provider  ARIPiprazole (ABILIFY) 10 MG tablet Take 1 tablet (10 mg total) by mouth daily. 06/20/13  Yes Derrick Fidel Levy, MD  omeprazole (PRILOSEC) 20 MG capsule Take 1 capsule (20 mg total) by mouth at bedtime. 08/23/15  Yes Derrick Ohara McDiarmid, MD  ibuprofen (ADVIL,MOTRIN) 600 MG tablet Take 1 tablet (600 mg total) by mouth every 6 (six) hours as needed. 10/16/15   Derrick Dell, PA-C  oxyCODONE (ROXICODONE) 5 MG immediate release tablet Take 1 tablet (5 mg total) by mouth every 6 (six) hours as needed for severe pain. 10/16/15   Derrick Dell, PA-C  rizatriptan (MAXALT) 10 MG tablet Take 1 tablet (10 mg total) by mouth as needed for migraine. May repeat in 2 hours if needed Patient not taking: Reported on 08/23/2015 09/26/14   Derrick Jude, MD   BP  134/90 mmHg  Pulse 90  Temp(Src) 98.5 F (36.9 C) (Oral)  Resp 18  Ht 5\' 11"  (1.803 m)  Wt 95.255 kg  BMI 29.30 kg/m2  SpO2 100% Physical Exam  Constitutional: He is oriented to person, place, and time. He appears well-developed and well-nourished. No distress.  HENT:  Head: Normocephalic and atraumatic.  Eyes: Conjunctivae and EOM are normal.  Neck: Neck supple. No tracheal deviation present.  Cardiovascular: Normal rate.   Pulmonary/Chest: Effort normal. No respiratory distress.  Musculoskeletal: Normal range of motion.       Right ankle: He exhibits swelling. He exhibits normal range of motion, no ecchymosis, no deformity, no laceration and normal pulse. Tenderness. Lateral malleolus tenderness found. Achilles tendon normal.       Feet:  Moderate swelling noted to right lateral malleolus with mild tenderness to palpation. FROM right knee and foot. 2+ DP pulse. Sensation grossly intact. Cap refill < 2.  Neurological: He is alert and oriented to person, place, and time.  Skin: Skin is warm and dry.  Psychiatric: He has a normal mood and affect. His behavior is normal.  Nursing note and vitals reviewed.   ED Course  Procedures (including critical care time)  DIAGNOSTIC STUDIES: Oxygen Saturation is 100% on RA, normal by my interpretation.    COORDINATION OF CARE: 6:14 PM Discussed treatment plan with pt at bedside and pt agreed to plan.  Labs Review Labs Reviewed - No data to display  Imaging Review Dg Ankle Complete Right  10/16/2015  CLINICAL DATA:  Inversion injury with lateral ankle pain, initial encounter EXAM: RIGHT ANKLE - COMPLETE 3+ VIEW COMPARISON:  None. FINDINGS: Considerable soft tissue swelling is noted laterally. A tiny bony density is noted adjacent to the distal aspect of the fibula consistent with a small avulsion fracture. No other fractures are seen. IMPRESSION: Tiny avulsion fracture from the fibula with considerable soft tissue swelling laterally.  Electronically Signed   By: Inez Catalina M.D.   On: 10/16/2015 19:50   I have personally reviewed and evaluated these images and lab results as part of my medical decision-making.   EKG Interpretation None      MDM   Final diagnoses:  Fibula fracture, right, closed, initial encounter  Ankle sprain, right, initial encounter    Patient presents with right ankle pain and swelling after falling off his porch and twisting his ankle. Denies head injury or LOC. VSS. Exam revealed moderate swelling and tenderness over right lateral malleolus. Right lower extremity otherwise neurovascular intact. Right ankle x-ray revealed tiny avulsion fracture from the fibula with considerable soft tissue swelling laterally. Patient placed in ankle splint Aircast and given crutches in the ED. Discussed results and plan for discharge with patient. Plan to discharge patient home with symptomatic treatment including pain meds, NSAIDs and RICE protocol. Patient given orthopedic information to follow-up outpatient in the next 1-2  weeks. Discussed return precautions with patient.  I personally performed the services described in this documentation, which was scribed in my presence. The recorded information has been reviewed and is accurate.   Chesley Noon [place], [place] 10/16/15 2317  Leonard Schwartz, MD 10/18/15 [phone]

## 2015-10-16 NOTE — ED Notes (Signed)
Pt states he fell off his porch today and twisted his right ankle

## 2015-10-16 NOTE — ED Notes (Signed)
See PAs notes for secondary assessment.  

## 2015-10-16 NOTE — Progress Notes (Signed)
Orthopedic Tech Progress Note Patient Details:  Derrick Ortiz Apr 09, 1971 [id number] Applied ankle Air Cast to RLE.  Pulses, sensation, motion intact before and after application.  Capillary refill less than 2 seconds before and after application.  Nursing staff completed order for crutches. Ortho Devices Type of Ortho Device: Ankle Air splint Ortho Device/Splint Location: RLE Ortho Device/Splint Interventions: Application   Darrol Poke 10/16/2015, 8:21 PM

## 2015-10-16 NOTE — Discharge Instructions (Signed)
Take your medications as prescribed as for pain relief. I recommend resting, elevating and applying ice to her right ankle for 15-20 minutes 3-4 times daily with pain and swelling. Continue wearing your ankle splint for the next week. I recommend using crutches for the next week, you may bear weight as tolerated after the first 3-4 days. I recommend following up with the orthopedist listed above in the next 2-3 weeks if you continue to have pain and swelling. Return to the emergency department if symptoms worsen or new onset of fever, redness, swelling, warmth, numbness, tingling, weakness.

## 2015-10-23 ENCOUNTER — Other Ambulatory Visit: Payer: Self-pay | Admitting: Family Medicine

## 2015-10-23 ENCOUNTER — Telehealth: Payer: Self-pay | Admitting: *Deleted

## 2015-10-23 DIAGNOSIS — S99911S Unspecified injury of right ankle, sequela: Secondary | ICD-10-CM

## 2015-10-23 NOTE — Telephone Encounter (Signed)
Referral placed by Dr. Wendy Poet

## 2015-10-23 NOTE — Progress Notes (Unsigned)
Referral to orthopedist for possible right ankle distal fibular fracture diagnosed in [facility] ED on 10/16/15.  Pt is ankle air cast to Right lower extremity from Ed.

## 2015-10-23 NOTE — Telephone Encounter (Signed)
Patient recently seen in the ED with a fractured ankle, needs referral to [facility] .

## 2015-10-23 NOTE — Telephone Encounter (Signed)
Will forward to MD to advise. Nasir Bright,CMA  

## 2015-11-01 ENCOUNTER — Encounter: Payer: Self-pay | Admitting: Family Medicine

## 2015-11-01 ENCOUNTER — Ambulatory Visit (INDEPENDENT_AMBULATORY_CARE_PROVIDER_SITE_OTHER): Payer: Medicaid Other | Admitting: Family Medicine

## 2015-11-01 ENCOUNTER — Ambulatory Visit
Admission: RE | Admit: 2015-11-01 | Discharge: 2015-11-01 | Disposition: A | Discharge status: Home / Self Care | Payer: Medicaid Other | Source: Ambulatory Visit | Attending: Family Medicine | Admitting: Family Medicine

## 2015-11-01 VITALS — BP 143/117 | HR 74 | Temp 98.2°F | Wt 219.0 lb

## 2015-11-01 DIAGNOSIS — G8929 Other chronic pain: Secondary | ICD-10-CM | POA: Diagnosis not present

## 2015-11-01 DIAGNOSIS — I10 Essential (primary) hypertension: Secondary | ICD-10-CM | POA: Diagnosis present

## 2015-11-01 DIAGNOSIS — E7800 Pure hypercholesterolemia, unspecified: Secondary | ICD-10-CM

## 2015-11-01 DIAGNOSIS — S99911D Unspecified injury of right ankle, subsequent encounter: Secondary | ICD-10-CM

## 2015-11-01 DIAGNOSIS — R109 Unspecified abdominal pain: Secondary | ICD-10-CM | POA: Diagnosis not present

## 2015-11-01 DIAGNOSIS — S8261XD Displaced fracture of lateral malleolus of right fibula, subsequent encounter for closed fracture with routine healing: Secondary | ICD-10-CM

## 2015-11-01 MED ORDER — INDAPAMIDE 2.5 MG PO TABS: 2.5000 mg | ORAL_TABLET | Freq: Every day | ORAL | Status: DC

## 2015-11-01 NOTE — Patient Instructions (Addendum)
Ankle injury Rest keep to activities just need to do like sort periods of shoping, short periods of house or yard work.  Spend most of the time in chair or bed with leg elevated to level to the level of the heart.  Ice every hour as needed for twenty minutes for swelling or pain Keep compression wrap on for next two weeks.  Elevate right leg to level of heart as much as possible throughout out the day to help get the swelling out of the ankle.  Adjust the Air-cast brace so you are able to wear it comfortably.  It helps push the swelling out of the ankle, too.   Dr Deira Shimer wil call with ankle x-ray results.    I believe you havve high blood pressure that will need you to reduce the amount of salt in your diet to less than 4000 mg a day,  And start a blood pressure medicine called Indapamide.  It is a water pill that will make you urinate out in your urine the excess salt and water in your body.  This will help reduce your blood pressure. Remember, it is not uncommon for people to need two to three blood pressure medicine to get the blood pressure under good control.   Se Dr Kimia Finan in two weeks to recheck your blood pressure.   Write down the blood pressure you measure at home for Dr Billie Trager to  See.  This will help a lot in deciding if your medicine is working for you.   We will set you up an appointment to meet with a Nutritionist to learn about where salt hides in foods.   You will be surprised were it hides.  We are checking your cholesterol today.   Call the Nutritionist, Dr Jenne Campus, to set up a nutrition consult about diet in hypertension.

## 2015-11-02 ENCOUNTER — Encounter: Payer: Self-pay | Admitting: Family Medicine

## 2015-11-02 ENCOUNTER — Telehealth: Payer: Self-pay | Admitting: Family Medicine

## 2015-11-02 DIAGNOSIS — S8261XA Displaced fracture of lateral malleolus of right fibula, initial encounter for closed fracture: Secondary | ICD-10-CM | POA: Insufficient documentation

## 2015-11-02 DIAGNOSIS — R109 Unspecified abdominal pain: Secondary | ICD-10-CM

## 2015-11-02 DIAGNOSIS — E7800 Pure hypercholesterolemia, unspecified: Secondary | ICD-10-CM | POA: Insufficient documentation

## 2015-11-02 DIAGNOSIS — G8929 Other chronic pain: Secondary | ICD-10-CM | POA: Insufficient documentation

## 2015-11-02 HISTORY — DX: Pure hypercholesterolemia, unspecified: E78.00

## 2015-11-02 HISTORY — DX: Displaced fracture of lateral malleolus of right fibula, initial encounter for closed fracture: S82.61XA

## 2015-11-02 LAB — LIPID PANEL
Cholesterol: 184 mg/dL (ref 125–200)
HDL: 40 mg/dL (ref >=40)
LDL CALC: 126 mg/dL (ref <130)
Total CHOL/HDL Ratio: 4.6 Ratio (ref <=5.0)
Triglycerides: 90 mg/dL (ref <150)
VLDL: 18 mg/dL (ref <30)

## 2015-11-02 NOTE — Telephone Encounter (Signed)
BP down onhome monitor to ~138/80 after taking first dose of Indapamide. The pain in right ankle decreased some with some keeping ankle elevated wrap compression and wearing Aircast.  Informed Mr Derrick Ortiz of his lipids and ankle Xray results No change in current mangt plan recommended at this time I would like to see patient in 2 to 3 weeks.

## 2015-11-02 NOTE — Assessment & Plan Note (Signed)
Established problem worsened.  Significant amount of ankle pain.  Swelling persists.  Patient not following RICE instructions from ED and I believe Ortho. Repeat righ ankle Xray today sis not show significant change from 10/16/15 ED Xray.   Ankle wrapped in coban in office, discussed improtance of relative rest of ankle, Ice for pain and swelling, adjusting Aircast to fit swelling.  Elevation of ankle to level of heart.  Use of just Tylenol for pain to avoid increase in BP from NSAIDs.  Gentle ROM demonstrated with drawing alphabet with foot several times a day.  RTC 2 weeks for re-evaluation.

## 2015-11-02 NOTE — Assessment & Plan Note (Signed)
New diagnosis ASCVD risk 10-yr CV Event 8.0%  Recommendation moderate to high intensity statin Will discuss with Mr Ellender on next ov in 2-3 weeks.

## 2015-11-02 NOTE — Assessment & Plan Note (Addendum)
New diagnosis Persistent elevation of BP in patient with strong familial genetic propensity for hypertension.  Hypertension in multiple first degree and second degree relatives.  No evidence of edn organ damage by history, labs, nor abdominal CT. Serum glucoses have been in normal range Lab Results  Component Value Date   LDLCALC 126 11/01/2015

## 2015-11-02 NOTE — Assessment & Plan Note (Signed)
New diagnosis No evidence of new end organ damage High sodium diet. Referral to nutritionist.  Patient to call Dr Jenne Campus to schedule appointment with her or find out options for low sodium diet counseling.  Start Indapamide 2.5 mg daily Recheck BMP on next office visit in 2 weeks.

## 2015-11-02 NOTE — Progress Notes (Signed)
   Subjective:    Patient ID: Derrick Ortiz, male    DOB: 1971-01-02, 45 y.o.   MRN: [id number]  HPI  Elevated BP HPI: Patient reports elevated BP over 140/100 on home BP monitor for last couple months.  It was elevated at ED and at orthopedists office.  Strong family history of hypertension in first degree relatives: Mo, Fa, Bro, Sister, also in second degree relative of aunts and uncles.  All are on antihypertensive medications.  Recent serum creatinine WNL and recent CT abd showed normal kidneys.  Chest pain: no  Dyspnea: no  Claudication: no  (+) bifrontal headache different from his migraine headaches.  Improves with APAP Medication compliance: on no antihypertensive medications.  Denies taking OTC NSAIDs, though he was prescribed Ibuprofen 600 mg 10/16/15 from ED, and 10 tablets percocet.  No erectile disfunction   Exercise: no  Diet Pattern: high in salt and carbohydrates Salt Restriction: not currently  Right ankle injury Onset: 10/16/15 Inversion injury falling from porch about 5 feet off ground. Swelling occurred immediately.  Pt seen in ED same day of injury.  Tiny Avulasion fx of distal right fibia noted with ankle sweally. Pt treated with Aircast, which patient has not been wearing because "it cut off my leg" due to swelling of ankle.  Pt has not followed RICE therapy instructions.  Referral to [facility] made by [facility] on 10/23/15. Pt seen by ortho but he is not certain of date or there recommendations, no records from ortho available.        Review of Systems See HPI No fever/chills,     Objective:   Physical Exam VS reviewed Reviewed nursing note findings with patient GEN: Alert, Cooperative, Groomed, NAD COR: RRR, No M/G/R, No JVD, Normal PMI size and location LUNGS: BCTA, No Acc mm use, speaking in full sentences ABDOMEN: (+)BS, soft, NT, ND, No HSM, No palpable masses  EXT: Right ankle edematous with increased warmth to palpation, tender to  palpation, palpable pedal pulses. Able to bear weight on ankle. Mildly ataglic gait with limiting stance phase time on right limb.  Neuro: intact to touch of L4/L5/S1 of left foot.   Imaging Xray of ankle (10/16/15) viewed and avulaion bone identified.      Assessment & Plan:

## 2015-11-09 ENCOUNTER — Ambulatory Visit: Payer: Medicaid Other | Admitting: Family Medicine

## 2015-11-13 ENCOUNTER — Ambulatory Visit (INDEPENDENT_AMBULATORY_CARE_PROVIDER_SITE_OTHER): Payer: Medicaid Other | Admitting: Family Medicine

## 2015-11-13 ENCOUNTER — Encounter: Payer: Self-pay | Admitting: Family Medicine

## 2015-11-13 VITALS — BP 139/83 | HR 86 | Temp 98.2°F | Ht 71.0 in | Wt 222.0 lb

## 2015-11-13 DIAGNOSIS — I10 Essential (primary) hypertension: Secondary | ICD-10-CM | POA: Diagnosis present

## 2015-11-13 DIAGNOSIS — E7800 Pure hypercholesterolemia, unspecified: Secondary | ICD-10-CM

## 2015-11-13 DIAGNOSIS — F17200 Nicotine dependence, unspecified, uncomplicated: Secondary | ICD-10-CM

## 2015-11-13 DIAGNOSIS — R82998 Other abnormal findings in urine: Secondary | ICD-10-CM

## 2015-11-13 DIAGNOSIS — R8299 Other abnormal findings in urine: Secondary | ICD-10-CM | POA: Diagnosis not present

## 2015-11-13 LAB — POCT URINALYSIS DIPSTICK
Bilirubin, UA: NEGATIVE
GLUCOSE UA: NEGATIVE
Ketones, UA: NEGATIVE
LEUKOCYTES UA: NEGATIVE
NITRITE UA: NEGATIVE
PROTEIN UA: NEGATIVE
Spec Grav, UA: 1.02
UROBILINOGEN UA: 1
pH, UA: 7

## 2015-11-13 LAB — POCT UA - MICROSCOPIC ONLY

## 2015-11-13 LAB — BASIC METABOLIC PANEL WITH GFR
BUN: 9 mg/dL (ref 7–25)
CHLORIDE: 106 mmol/L (ref 98–110)
CO2: 26 mmol/L (ref 20–31)
Calcium: 9.3 mg/dL (ref 8.6–10.3)
Creat: 0.87 mg/dL (ref 0.60–1.35)
GFR, Est African American: >89 mL/min (ref >=60)
GFR, Est Non African American: >89 mL/min (ref >=60)
Glucose, Bld: 91 mg/dL (ref 65–99)
POTASSIUM: 4.1 mmol/L (ref 3.5–5.3)
Sodium: 139 mmol/L (ref 135–146)

## 2015-11-13 NOTE — Assessment & Plan Note (Signed)
ASCVD risks greater than 7.5%.  Discussed statin, but will defer until hypertension medication started and tolerated prior to starting statin

## 2015-11-13 NOTE — Patient Instructions (Addendum)
Follow-up with Dr. Valentina Lucks for ambulatory blood pressure monitoring.  - Continue to work on quitting smoking, as well as low-salt diet  I have order some labs today to check your dark urine. I will send you a letter with the results, or call you if we need to make any changes to your current therapies.

## 2015-11-13 NOTE — Progress Notes (Signed)
  Patient name: Derrick Ortiz MRN [id number]  Date of birth: May 15, 1971  CC & HPI:  Derrick Ortiz is a 45 y.o. male presenting today for Hypertension follow-up.  He reports developing a rash, general malaise, right lumbar pain and dark urine.  After starting Indapamide.  He stopped the medication after 2-3 days and symptoms resolved.  Denies swelling of his lips, but felt like his throat was a little bit swollen.  Denies difficulty breathing with these symptoms.  Denies previous allergies to sulfa medications.  He has been checking his blood pressure since stopping the medication.  Reports BP range 130-145 / ~ 90.  Denies fever, chills, dysuria.  Denies blood in urine.  Denies history of kidney stones.   Smoking History Noted ROS: See history of present illness  Objective Findings:  Vitals: BP 139/83 mmHg  Pulse 86  Temp(Src) 98.2 F (36.8 C) (Oral)  Ht 5\' 11"  (1.803 m)  Wt 222 lb (100.699 kg)  BMI 30.98 kg/m2  Gen: NAD CV: RRR w/o m/r/g, pulses +2 b/l Resp: CTAB w/ normal respiratory effort Skin: no rashss  Assessment & Plan:   Dark urine Report dark urine and right flank pain associated with symptoms of allergic reaction due to indapamide.  Due to concern for kidney injury related to sulfa medication will check UA and BMP  [employer] Recommend smoking cessation  Primary hypertension Discussed treatment with different hypertensive medication versus additional evaluation.  - He opts for ambulatory blood pressure monitoring - Will f/u wit Dr Valentina Lucks for Amp BP and PCP based on results - Discussed DASH diet, exercise, smoking cessation for improvement of hypertensive  Pure hypercholesterolemia ASCVD risks greater than 7.5%.  Discussed statin, but will defer until hypertension medication started and tolerated prior to starting statin

## 2015-11-13 NOTE — Assessment & Plan Note (Signed)
Report dark urine and right flank pain associated with symptoms of allergic reaction due to indapamide.  Due to concern for kidney injury related to sulfa medication will check UA and BMP

## 2015-11-13 NOTE — Assessment & Plan Note (Signed)
Discussed treatment with different hypertensive medication versus additional evaluation.  - He opts for ambulatory blood pressure monitoring - Will f/u wit Dr Valentina Lucks for Amp BP and PCP based on results - Discussed DASH diet, exercise, smoking cessation for improvement of hypertensive

## 2015-11-13 NOTE — Assessment & Plan Note (Signed)
Recommend smoking cessation.

## 2015-11-14 NOTE — Addendum Note (Signed)
Addended by: Dorna Bloom on: 11/14/2015 01:48 PM   Modules accepted: Miquel Dunn

## 2015-11-19 ENCOUNTER — Encounter: Payer: Self-pay | Admitting: Pharmacist

## 2015-11-19 ENCOUNTER — Ambulatory Visit (INDEPENDENT_AMBULATORY_CARE_PROVIDER_SITE_OTHER): Payer: Medicaid Other | Admitting: Pharmacist

## 2015-11-19 VITALS — BP 139/97 | HR 87 | Ht 71.0 in | Wt 220.0 lb

## 2015-11-19 DIAGNOSIS — I10 Essential (primary) hypertension: Secondary | ICD-10-CM | POA: Diagnosis present

## 2015-11-19 DIAGNOSIS — F17200 Nicotine dependence, unspecified, uncomplicated: Secondary | ICD-10-CM

## 2015-11-19 NOTE — Progress Notes (Signed)
S:    Patient arrives in good spirits, walking without assistance.    Presents to the clinic for ambulatory blood pressure evaluation.  Patient was referred on 11/13/2015.  Patient was last seen by Primary Care Provider on 11/02/2015. Diagnosed with Hypertension recently and Amb BP monitoring is being used to gain more information about readings outside of the office.   Discussed procedure for wearing the monitor and gave patient written instructions. Monitor was placed on non-dominant arm with instructions to return in the morning.   Current BP Medications include:  NONE  Antihypertensives tried in the past include: indapamide (developed intolerance)  Patient returned to the clinic and reported no problems with meter.   Patient reported mowing the lawn shortly after leaving appointment, which took him about 2 hours. He reported taking frequent breaks while doing yard work because he was feeling tired. He logged some walking/exercise again at 3:00-4:00 pm. Denied drinking soda or energy drinks during the day. He reported going to bed about 12:15 am and woke up around 6:00 am.   Started smoking at age 71 in response to stress and because of the people he was around. Reported to smoking 1 pack per day for about 10 years but recently cut down to between 5-10 cigarettes per day. Estimated smoking about 7 cigarettes yesterday, split throughout the day. Reports wanting to stop smoking within the next 2-3 months. Has never tried nicotine replacement therapy.   O:   Last 3 Office BP readings: BP Readings from Last 3 Encounters:  11/19/15 139/97  11/13/15 139/83  11/01/15 143/117   ABPM Study Data: Arm Placement left arm   Overall Mean 24hr BP:   126/75 mmHg HR: 102   Daytime Mean BP:  129/77 mmHg HR: 105   Nighttime Mean BP:  111/60 mmHg HR: 84   Dipping Pattern: Yes.    Sys:   14.2%   Dia: 22.7%   [normal dipping ~10-20%]   Non-hypertensive ABPM thresholds: daytime BP <135/85 mmHg,  sleeptime BP <120/70 mmHg NICE Hypertension Guidelines ([place]) using ABPM: Stage I: >135/85 mmHg, Stage 2: >150/95 mmHg)   BMET    Component Value Date/Time   NA 139 11/13/2015 1143   K 4.1 11/13/2015 1143   CL 106 11/13/2015 1143   CO2 26 11/13/2015 1143   GLUCOSE 91 11/13/2015 1143   BUN 9 11/13/2015 1143   CREATININE 0.87 11/13/2015 1143   CREATININE 0.86 03/22/2013 1023   CALCIUM 9.3 11/13/2015 1143   GFRNONAA >89 11/13/2015 1143   GFRNONAA >90 03/22/2013 1023   GFRAA >89 11/13/2015 1143   GFRAA >90 03/22/2013 1023    A/P: Concern for hypertension since 11/01/15 in which BP in clinic was slightly elevated. Given 24-hour ambulatory blood pressure which demonstrated normotensive BP values with an average blood pressure of 126/75 mmHg, and a nocturnal dipping pattern that is normal.  No medications for BP lowering were initiated today.  24-hour ambulatory blood pressure monitor demonstrated tachycardia, especially with exertion, with an average heart rate of 102 bpm and HRs between 110-145 bpm for approx. 6 hours during the day. - Reviewed this finding with Dr. Nori Riis and instructed patient to follow up with Dr. McDiarmid to address this issue.   Patient demonstrated strong desire to quit smoking. Provided the patient with 1-800-QUIT-NOW phone number and encouraged nicotine replacement therapy for tobacco cessation.    Results reviewed and written information provided.  Total time in face-to-face counseling 35 minutes.  F/U Clinic Visit with Dr. Wendy Poet  in 2-3 weeks.  Patient seen with Mikael Spray, PharmD Candidate and Joya San, PharmD Resident.

## 2015-11-19 NOTE — Patient Instructions (Addendum)
Your blood pressure looks normal but heart rate is elevated. Follow-up with Dr. McDiarmid in 2-3 weeks.   Use the 1-800-QUITNOW line for help quitting smoking.

## 2015-11-20 NOTE — Assessment & Plan Note (Signed)
Concern for hypertension since 11/01/15 in which BP in clinic was slightly elevated. Given 24-hour ambulatory blood pressure which demonstrated normotensive BP values with an average blood pressure of 126/75 mmHg, and a nocturnal dipping pattern that is normal.  No medications for BP lowering were initiated today.  24-hour ambulatory blood pressure monitor demonstrated tachycardia, especially with exertion, with an average heart rate of 102 bpm and HRs between 110-145 bpm for approx. 6 hours during the day. - Reviewed this finding with Dr. Nori Riis and instructed patient to follow up with Dr. McDiarmid to address this issue.

## 2015-11-20 NOTE — Assessment & Plan Note (Signed)
Patient demonstrated strong desire to quit smoking. Provided the patient with 1-800-QUIT-NOW phone number and encouraged nicotine replacement therapy for tobacco cessation.

## 2015-11-21 NOTE — Progress Notes (Signed)
Patient ID: Derrick Ortiz, male   DOB: 30-May-1971, 45 y.o.   MRN: [id number] Reviewed: Agree with Dr. Graylin Shiver documentation and management.

## 2015-11-29 ENCOUNTER — Ambulatory Visit: Payer: Medicaid Other | Admitting: Family Medicine

## 2015-12-06 ENCOUNTER — Ambulatory Visit: Payer: Medicaid Other | Admitting: Family Medicine

## 2015-12-10 ENCOUNTER — Encounter: Payer: Self-pay | Admitting: Family Medicine

## 2015-12-10 DIAGNOSIS — S93401A Sprain of unspecified ligament of right ankle, initial encounter: Secondary | ICD-10-CM

## 2015-12-10 NOTE — Progress Notes (Signed)
Patient ID: KHAEL LINO, male   DOB: May 22, 1971, 45 y.o.   MRN: [id number]  Office Visit note (10/26/15) [facility] CC: ankle complaint A/ Sprain of other ligaments of right ankle, initial encounter P/ Start Nabumetone 750 mg one tab twice a day prn #60 no refill.       F/U in 2 weeks  Provider: Lyndal Pulley. Mayo PA-C

## 2015-12-20 ENCOUNTER — Encounter: Payer: Self-pay | Admitting: Family Medicine

## 2015-12-20 ENCOUNTER — Ambulatory Visit (INDEPENDENT_AMBULATORY_CARE_PROVIDER_SITE_OTHER): Payer: Medicaid Other | Admitting: Family Medicine

## 2015-12-20 VITALS — BP 130/85 | HR 82 | Temp 98.1°F | Ht 71.0 in | Wt 210.0 lb

## 2015-12-20 DIAGNOSIS — L259 Unspecified contact dermatitis, unspecified cause: Secondary | ICD-10-CM | POA: Diagnosis present

## 2015-12-20 MED ORDER — TRIAMCINOLONE ACETONIDE 0.5 % EX OINT: 1.0000 "application " | TOPICAL_OINTMENT | Freq: Two times a day (BID) | CUTANEOUS | Status: DC

## 2015-12-20 NOTE — Patient Instructions (Signed)
Contact Dermatitis Dermatitis is redness, soreness, and swelling (inflammation) of the skin. Contact dermatitis is a reaction to certain substances that touch the skin. There are two types of contact dermatitis:   Irritant contact dermatitis. This type is caused by something that irritates your skin, such as dry hands from washing them too much. This type does not require previous exposure to the substance for a reaction to occur. This type is more common.  Allergic contact dermatitis. This type is caused by a substance that you are allergic to, such as a nickel allergy or poison ivy. This type only occurs if you have been exposed to the substance (allergen) before. Upon a repeat exposure, your body reacts to the substance. This type is less common. CAUSES  Many different substances can cause contact dermatitis. Irritant contact dermatitis is most commonly caused by exposure to:   Makeup.   Soaps.   Detergents.   Bleaches.   Acids.   Metal salts, such as nickel.  Allergic contact dermatitis is most commonly caused by exposure to:   Poisonous plants.   Chemicals.   Jewelry.   Latex.   Medicines.   Preservatives in products, such as clothing.  RISK FACTORS This condition is more likely to develop in:   People who have jobs that expose them to irritants or allergens.  People who have certain medical conditions, such as asthma or eczema.  SYMPTOMS  Symptoms of this condition may occur anywhere on your body where the irritant has touched you or is touched by you. Symptoms include:  Dryness or flaking.   Redness.   Cracks.   Itching.   Pain or a burning feeling.   Blisters.  Drainage of small amounts of blood or clear fluid from skin cracks. With allergic contact dermatitis, there may also be swelling in areas such as the eyelids, mouth, or genitals.  DIAGNOSIS  This condition is diagnosed with a medical history and physical exam. A patch skin test  may be performed to help determine the cause. If the condition is related to your job, you may need to see an occupational medicine specialist. TREATMENT Treatment for this condition includes figuring out what caused the reaction and protecting your skin from further contact. Treatment may also include:   Steroid creams or ointments. Oral steroid medicines may be needed in more severe cases.  Antibiotics or antibacterial ointments, if a skin infection is present.  Antihistamine lotion or an antihistamine taken by mouth to ease itching.  A bandage (dressing). HOME CARE INSTRUCTIONS Skin Care  Moisturize your skin as needed.   Apply cool compresses to the affected areas.  Try taking a bath with:  Epsom salts. Follow the instructions on the packaging. You can get these at your local pharmacy or grocery store.  Baking soda. Pour a small amount into the bath as directed by your health care provider.  Colloidal oatmeal. Follow the instructions on the packaging. You can get this at your local pharmacy or grocery store.  Try applying baking soda paste to your skin. Stir water into baking soda until it reaches a paste-like consistency.  Do not scratch your skin.  Bathe less frequently, such as every other day.  Bathe in lukewarm water. Avoid using hot water. Medicines  Take or apply over-the-counter and prescription medicines only as told by your health care provider.   If you were prescribed an antibiotic medicine, take or apply your antibiotic as told by your health care provider. Do not stop using the   antibiotic even if your condition starts to improve. General Instructions  Keep all follow-up visits as told by your health care provider. This is important.  Avoid the substance that caused your reaction. If you do not know what caused it, keep a journal to try to track what caused it. Write down:  What you eat.  What cosmetic products you use.  What you drink.  What  you wear in the affected area. This includes jewelry.  If you were given a dressing, take care of it as told by your health care provider. This includes when to change and remove it. SEEK MEDICAL CARE IF:   Your condition does not improve with treatment.  Your condition gets worse.  You have signs of infection such as swelling, tenderness, redness, soreness, or warmth in the affected area.  You have a fever.  You have new symptoms. SEEK IMMEDIATE MEDICAL CARE IF:   You have a severe headache, neck pain, or neck stiffness.  You vomit.  You feel very sleepy.  You notice red streaks coming from the affected area.  Your bone or joint underneath the affected area becomes painful after the skin has healed.  The affected area turns darker.  You have difficulty breathing.   This information is not intended to replace advice given to you by your health care provider. Make sure you discuss any questions you have with your health care provider.   Document Released: 05/16/2000 Document Revised: 02/07/2015 Document Reviewed: 10/04/2014 Elsevier Interactive Patient Education 2016 [employer] Inc[employer]  

## 2015-12-21 ENCOUNTER — Encounter: Payer: Self-pay | Admitting: Family Medicine

## 2015-12-21 DIAGNOSIS — L259 Unspecified contact dermatitis, unspecified cause: Secondary | ICD-10-CM | POA: Insufficient documentation

## 2015-12-21 HISTORY — DX: Unspecified contact dermatitis, unspecified cause: L25.9

## 2015-12-21 NOTE — Progress Notes (Signed)
   Subjective:    Patient ID: Derrick Ortiz, male    DOB: 01-30-1971, 45 y.o.   MRN: [id number] Derrick Ortiz is alone Sources of clinical information for visit is/are patient and medical record. Nursing assessment for this office visit was reviewed with the patient for accuracy and revision.   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  He is actively trying to quit on his own without help.  He has quit-now telephone number from Dr Valentina Lucks.    Review of Systems No headahce No chest pain No oral lesions.     Objective:   Physical Exam  Skin: Skin is dry. Rash (excoriateions, eczematous in places) noted. Rash is papular.      VS reviewed Gen: NAD, well nourished       Assessment & Plan:

## 2015-12-21 NOTE — Assessment & Plan Note (Signed)
New problem No further workup Start Topical triamcinolone o.5% cream to itching rash.  Continue for a week.  Restart if rash returns when stop steroid cream Pramoxine topical gel or cream (OTC)

## 2016-07-24 ENCOUNTER — Encounter: Payer: Self-pay | Admitting: Family Medicine

## 2016-07-24 ENCOUNTER — Ambulatory Visit (INDEPENDENT_AMBULATORY_CARE_PROVIDER_SITE_OTHER): Payer: Medicaid Other | Admitting: Family Medicine

## 2016-07-24 VITALS — BP 144/84 | HR 98 | Temp 98.3°F | Ht 71.0 in | Wt 222.0 lb

## 2016-07-24 DIAGNOSIS — N529 Male erectile dysfunction, unspecified: Secondary | ICD-10-CM | POA: Diagnosis not present

## 2016-07-24 LAB — TSH: TSH: 1.69 m[IU]/L (ref 0.40–4.50)

## 2016-07-24 MED ORDER — SUMATRIPTAN SUCCINATE 100 MG PO TABS: 100.0000 mg | ORAL_TABLET | ORAL | 99 refills | Status: DC | PRN

## 2016-07-24 MED ORDER — SILDENAFIL CITRATE 100 MG PO TABS: 100.0000 mg | ORAL_TABLET | Freq: Every day | ORAL | 3 refills | Status: DC | PRN

## 2016-07-24 NOTE — Patient Instructions (Signed)
Dr Jamariya Davidoff will call you if your tests are not good. Otherwise he will send you a letter.  If you sign up for MyChart online, you will be able to see your test results once Dr Shjon Lizarraga has reviewed them.  If you do not hear from [place] with in 2 weeks please call our office  Erectile Dysfunction Erectile dysfunction is the inability to get or sustain a good enough erection to have sexual intercourse. Erectile dysfunction may involve:  Inability to get an erection.  Lack of enough hardness to allow penetration.  Loss of the erection before sex is finished.  Premature ejaculation. CAUSES  Certain drugs, such as:  Pain relievers.  Antihistamines.  Antidepressants.  Blood pressure medicines.  Water pills (diuretics).  Ulcer medicines.  Muscle relaxants.  Illegal drugs.  Excessive drinking.  Psychological causes, such as:  Anxiety.  Depression.  Sadness.  Exhaustion.  Performance fear.  Stress.  Physical causes, such as:  Artery problems. This may include diabetes, smoking, liver disease, or atherosclerosis.  High blood pressure.  Hormonal problems, such as low testosterone.  Obesity.  Nerve problems. This may include back or pelvic injuries, diabetes mellitus, multiple sclerosis, or Parkinson disease. SYMPTOMS  Inability to get an erection.  Lack of enough hardness to allow penetration.  Loss of the erection before sex is finished.  Premature ejaculation.  Normal erections at some times, but with frequent unsatisfactory episodes.  Orgasms that are not satisfactory in sensation or frequency.  Low sexual satisfaction in either partner because of erection problems.  A curved penis occurring with erection. The curve may cause pain or may be too curved to allow for intercourse.  Never having nighttime erections. DIAGNOSIS Your caregiver can often diagnose this condition by:  Performing a physical exam to find other diseases or specific  problems with the penis.  Asking you detailed questions about the problem.  Performing blood tests to check for diabetes mellitus or to measure hormone levels.  Performing urine tests to find other underlying health conditions.  Performing an ultrasound exam to check for scarring.  Performing a test to check blood flow to the penis.  Doing a sleep study at home to measure nighttime erections. TREATMENT   You may be prescribed medicines by mouth.  You may be given medicine injections into the penis.  You may be prescribed a vacuum pump with a ring.  Penile implant surgery may be performed. You may receive:  An inflatable implant.  A semirigid implant.  Blood vessel surgery may be performed. HOME CARE INSTRUCTIONS  If you are prescribed oral medicine, you should take the medicine as prescribed. Do not increase the dosage without first discussing it with your physician.  If you are using self-injections, be careful to avoid any veins that are on the surface of the penis. Apply pressure to the injection site for 5 minutes.  If you are using a vacuum pump, make sure you have read the instructions before using it. Discuss any questions with your physician before taking the pump home. SEEK MEDICAL CARE IF:  You experience pain that is not responsive to the pain medicine you have been prescribed.  You experience nausea or vomiting. SEEK IMMEDIATE MEDICAL CARE IF:   When taking oral or injectable medications, you experience an erection that lasts longer than 4 hours. If your physician is unavailable, go to the nearest emergency room for evaluation. An erection that lasts much longer than 4 hours can result in permanent damage to your penis.  You have pain that is severe.  You develop redness, severe pain, or severe swelling of your penis.  You have redness spreading up into your groin or lower abdomen.  You are unable to pass your urine. This information is not intended to  replace advice given to you by your health care provider. Make sure you discuss any questions you have with your health care provider. Document Released: 05/16/2000 Document Revised: 01/19/2013 Document Reviewed: 10/21/2012 Elsevier Interactive Patient Education  2017 [employer].

## 2016-07-24 NOTE — Progress Notes (Signed)
   Subjective:    Patient ID: Derrick Ortiz, male    DOB: May 02, 1971, 46 y.o.   MRN: [id number] Derrick Ortiz is alone Sources of clinical information for visit is/are patient and past medical records. Nursing assessment for this office visit was reviewed with the patient for accuracy and revision.  Patient said his wife urged him to come into office with his concern HPI  Difficulty with penile erection - Ongoing problem for at least the last yea - progressively worsening - Seen previously by Dr Andree Moro in 2013 who felt his complaint of erectile dysfunction was primarily psychosocial, and related to intimacy issues, and masturbation with internet images.  - Pt reorts has libido before and during intercourse. - He attains an erection adequate for vaginal penetration, but has detumescence before climax phase.  - He does have nocturnal erections.  - no galactorrhea, no change in visual field.  - no prior use of PDE inhibitors. - Normal CMET and Lipid panel last year. - Reports mutually monogamous marriage with sexual desire and receptivity by his partenr   Medication list reivewed and updated.  No longer on any psychotropic medications PMH: no diagnosed CAP/PAD. No hyperlipidemia. SH: Smokes quarter to half pack a day    Review of Systems No auditory hallucinations   No claudication No chest pain Objective:   Physical Exam  VS reviewed GEN: Alert, Cooperative, Groomed, NAD COR: RRR, No M/G/R,  LUNGS: BCTA, No Acc mm use, speaking in full sentences GU: bilaterally desc testes of normal volume without nodularity or tenderness.  Pubic hair in Tanner five distribution EXT: No peripheral leg edema.  Palpable bilateral pedal pulses.  Psych: Normal affect/thought/speech/language    Assessment & Plan:  See px list

## 2016-07-24 NOTE — Assessment & Plan Note (Signed)
Established problem worsened.  Will check total testosterone, TSH and prolactin Trial of Viagra 100 mg tablets, 50 -100 mg doses

## 2016-07-25 LAB — TESTOSTERONE: TESTOSTERONE: 532 ng/dL (ref 250–827)

## 2016-07-25 LAB — PROLACTIN: PROLACTIN: 3.2 ng/mL (ref 2.0–18.0)

## 2016-07-31 ENCOUNTER — Encounter: Payer: Self-pay | Admitting: Family Medicine

## 2016-12-19 ENCOUNTER — Encounter: Payer: Self-pay | Admitting: Family Medicine

## 2016-12-19 ENCOUNTER — Ambulatory Visit (INDEPENDENT_AMBULATORY_CARE_PROVIDER_SITE_OTHER): Payer: 59 | Admitting: Family Medicine

## 2016-12-19 VITALS — BP 122/70 | HR 88 | Temp 98.1°F | Ht 71.0 in | Wt 221.0 lb

## 2016-12-19 DIAGNOSIS — K648 Other hemorrhoids: Secondary | ICD-10-CM | POA: Diagnosis not present

## 2016-12-19 MED ORDER — POLYETHYLENE GLYCOL 3350 17 GM/SCOOP PO POWD: 17.0000 g | Freq: Two times a day (BID) | ORAL | 1 refills | Status: DC | PRN

## 2016-12-19 MED ORDER — HYDROCORTISONE ACE-PRAMOXINE 1-1 % RE CREA: 1.0000 "application " | TOPICAL_CREAM | Freq: Two times a day (BID) | RECTAL | 0 refills | Status: DC

## 2016-12-19 NOTE — Patient Instructions (Addendum)
It was great seeing you today! We have addressed the following issues today  1. You have internal hemorrhoids explaining your bleeding. I want you to do Sitz baths, I also prescribe miralax a stool softner and a cream to help with the itching and burning.   If we did any lab work today, and the results require attention, either me or my nurse will get in touch with you. If everything is normal, you will get a letter in mail and a message via . If you don't hear from [place] in two weeks, please give [place] a call. Otherwise, we look forward to seeing you again at your next visit. If you have any questions or concerns before then, please call the clinic at [phone].  Please bring all your medications to every doctors visit  Sign up for My Chart to have easy access to your labs results, and communication with your Primary care physician. Please ask Front Desk for some assistance.   Please check-out at the front desk before leaving the clinic.    Take Care,   Dr. Andy Gauss   Hemorrhoids Hemorrhoids are swollen veins in and around the rectum or anus. There are two types of hemorrhoids:  Internal hemorrhoids. These occur in the veins that are just inside the rectum. They may poke through to the outside and become irritated and painful.  External hemorrhoids. These occur in the veins that are outside of the anus and can be felt as a painful swelling or hard lump near the anus.  Most hemorrhoids do not cause serious problems, and they can be managed with home treatments such as diet and lifestyle changes. If home treatments do not help your symptoms, procedures can be done to shrink or remove the hemorrhoids. What are the causes? This condition is caused by increased pressure in the anal area. This pressure may result from various things, including:  Constipation.  Straining to have a bowel movement.  Diarrhea.  Pregnancy.  Obesity.  Sitting for long periods of time.  Heavy lifting or  other activity that causes you to strain.  Anal sex.  What are the signs or symptoms? Symptoms of this condition include:  Pain.  Anal itching or irritation.  Rectal bleeding.  Leakage of stool (feces).  Anal swelling.  One or more lumps around the anus.  How is this diagnosed? This condition can often be diagnosed through a visual exam. Other exams or tests may also be done, such as:  Examination of the rectal area with a gloved hand (digital rectal exam).  Examination of the anal canal using a small tube (anoscope).  A blood test, if you have lost a significant amount of blood.  A test to look inside the colon (sigmoidoscopy or colonoscopy).  How is this treated? This condition can usually be treated at home. However, various procedures may be done if dietary changes, lifestyle changes, and other home treatments do not help your symptoms. These procedures can help make the hemorrhoids smaller or remove them completely. Some of these procedures involve surgery, and others do not. Common procedures include:  Rubber band ligation. Rubber bands are placed at the base of the hemorrhoids to cut off the blood supply to them.  Sclerotherapy. Medicine is injected into the hemorrhoids to shrink them.  Infrared coagulation. A type of light energy is used to get rid of the hemorrhoids.  Hemorrhoidectomy surgery. The hemorrhoids are surgically removed, and the veins that supply them are tied off.  Stapled hemorrhoidopexy  surgery. A circular stapling device is used to remove the hemorrhoids and use staples to cut off the blood supply to them.  Follow these instructions at home: Eating and drinking  Eat foods that have a lot of fiber in them, such as whole grains, beans, nuts, fruits, and vegetables. Ask your health care provider about taking products that have added fiber (fiber supplements).  Drink enough fluid to keep your urine clear or pale yellow. Managing pain and  swelling  Take warm sitz baths for 20 minutes, 3-4 times a day to ease pain and discomfort.  If directed, apply ice to the affected area. Using ice packs between sitz baths may be helpful. ? Put ice in a plastic bag. ? Place a towel between your skin and the bag. ? Leave the ice on for 20 minutes, 2-3 times a day. General instructions  Take over-the-counter and prescription medicines only as told by your health care provider.  Use medicated creams or suppositories as told.  Exercise regularly.  Go to the bathroom when you have the urge to have a bowel movement. Do not wait.  Avoid straining to have bowel movements.  Keep the anal area dry and clean. Use wet toilet paper or moist towelettes after a bowel movement.  Do not sit on the toilet for long periods of time. This increases blood pooling and pain. Contact a health care provider if:  You have increasing pain and swelling that are not controlled by treatment or medicine.  You have uncontrolled bleeding.  You have difficulty having a bowel movement, or you are unable to have a bowel movement.  You have pain or inflammation outside the area of the hemorrhoids. This information is not intended to replace advice given to you by your health care provider. Make sure you discuss any questions you have with your health care provider. Document Released: 05/16/2000 Document Revised: 10/17/2015 Document Reviewed: 01/31/2015 Elsevier Interactive Patient Education  2017 [employer].

## 2016-12-20 NOTE — Progress Notes (Signed)
   Subjective:    Patient ID: Derrick Ortiz, male    DOB: 04/27/1971, 46 y.o.   MRN: [id number]   CC: Blood in stool  HPI: Patient is a 46 year old male with a past medical history significant for hemorrhoids were present today complaining of blood in his stools. Patient reports that 2 days ago he noted bright red blood in his stool.. Patient also endorses burning and itching post defecation. Patient has a history of hemorrhoids, but hasn't had a flare up in the past few years. Patient reports that he was constipated 2 weeks ago. Patient denies any melena, abdominal pain, nausea, vomiting.  Smoking status reviewed   ROS: all other systems were reviewed and are negative other than in the HPI   Past Medical History:  Diagnosis Date  . Acute right flank pain 09/22/2013  . Anxiety and depression 09/26/2014  . Arthritis   . Avulsion fracture of lateral malleolus of right fibula 11/02/2015  . Biliary dyskinesia 03/18/2013  . Contact dermatitis 12/21/2015  . Erectile dysfunction 08/25/2011  . GERD (gastroesophageal reflux disease)   . Headache(784.0)   . Hematuria 01/17/2013   Hematuria x2 on UAs w/ flank/groin pain, but neg CT. Benign vs stones vs other process Smoker concerning for bladder pathology. Urine cytology atypical reactive urothelial cells. Urology consultation 02/08/13 (Dr B. Herrick): Cystoscopy showed erythematous patch on bladder mucosa at [facility]. Bx sent .   Rx low-dose viagra.   Urology suspected flank pain is musculoskeletal. Recommended Physical Therapy.       Marland Kitchen HEMORRHOIDS, EXTERNAL 08/09/2009   Qualifier: Diagnosis of  By: McDiarmid MD, Sherren Mocha    . History of hemorrhoids 08/09/2009   Qualifier: Diagnosis of  By: McDiarmid MD, Sherren Mocha    . Hx of migraines    takes Propranolol and Imitrex prn migraines  . Primary hypertension 08/23/2015  . Pure hypercholesterolemia 11/02/2015  . Sexual dysfunction 06/21/2013  . TMJ arthralgia 05/16/2014  . Vitamin D deficiency 09/27/2013      Objective:  BP 122/70   Pulse 88   Temp 98.1 F (36.7 C) (Oral)   Ht 5\' 11"  (1.803 m)   Wt 221 lb (100.2 kg)   SpO2 97%   BMI 30.82 kg/m   Vitals and nursing note reviewed  General: NAD, pleasant, able to participate in exam Cardiac: RRR, normal heart sounds, no murmurs. 2+ radial and PT pulses bilaterally Respiratory: CTAB, normal effort, No wheezes, rales or rhonchi Abdomen: soft, nontender, nondistended, no hepatic or splenomegaly, +BS GU: Anus was examined, no fissures or external hemorrhoids noted. Anoscopy was performed and showed evidence of nonbleeding internal hemorrhoids. Otherwise normal internal mucosa no mass noted Extremities: no edema or cyanosis. WWP. Skin: warm and dry, no rashes noted Neuro: alert and oriented x4, no focal deficits Psych: Normal affect and mood   Assessment & Plan:   #Non bleeding internal hemorrhoids Patient presented with bright red blood in stool concerning for hemorrhoids. Anoscopy was performed and was positive for nonbleeding internal hemorrhoids. Patient with extensive prior history hemorrhoids and recent episodes of constipation likely causing a flareup. --Will prescribe MiraLAX 17 g once or twice a day as needed --Will prescribe pramoxine-hydrocortisone cream to use as needed --Patient will follow up with PCP as needed  Marjie Skiff, MD [facility] PGY-2

## 2017-07-16 ENCOUNTER — Other Ambulatory Visit: Payer: Self-pay

## 2017-07-16 ENCOUNTER — Encounter: Payer: Self-pay | Admitting: Family Medicine

## 2017-07-16 ENCOUNTER — Ambulatory Visit (INDEPENDENT_AMBULATORY_CARE_PROVIDER_SITE_OTHER): Payer: 59 | Admitting: Family Medicine

## 2017-07-16 VITALS — BP 122/70 | HR 82 | Temp 98.6°F | Ht 71.0 in | Wt 212.6 lb

## 2017-07-16 DIAGNOSIS — F17200 Nicotine dependence, unspecified, uncomplicated: Secondary | ICD-10-CM

## 2017-07-16 DIAGNOSIS — Z13220 Encounter for screening for lipoid disorders: Secondary | ICD-10-CM | POA: Diagnosis not present

## 2017-07-16 DIAGNOSIS — K219 Gastro-esophageal reflux disease without esophagitis: Secondary | ICD-10-CM | POA: Diagnosis not present

## 2017-07-16 DIAGNOSIS — K644 Residual hemorrhoidal skin tags: Secondary | ICD-10-CM | POA: Diagnosis not present

## 2017-07-16 DIAGNOSIS — Z6830 Body mass index (BMI) 30.0-30.9, adult: Secondary | ICD-10-CM

## 2017-07-16 DIAGNOSIS — E669 Obesity, unspecified: Secondary | ICD-10-CM | POA: Diagnosis not present

## 2017-07-16 DIAGNOSIS — G43009 Migraine without aura, not intractable, without status migrainosus: Secondary | ICD-10-CM | POA: Diagnosis not present

## 2017-07-16 MED ORDER — OMEPRAZOLE 20 MG PO CPDR: 20.0000 mg | DELAYED_RELEASE_CAPSULE | Freq: Every day | ORAL | 3 refills | Status: DC

## 2017-07-16 MED ORDER — POLYETHYLENE GLYCOL 3350 17 GM/SCOOP PO POWD: 17.0000 g | Freq: Two times a day (BID) | ORAL | 1 refills | Status: DC | PRN

## 2017-07-16 MED ORDER — HYDROCORTISONE ACE-PRAMOXINE 1-1 % RE CREA: 1.0000 "application " | TOPICAL_CREAM | Freq: Two times a day (BID) | RECTAL | 0 refills | Status: DC

## 2017-07-16 MED ORDER — NICOTINE POLACRILEX 4 MG MT GUM: 4.0000 mg | CHEWING_GUM | OROMUCOSAL | 0 refills | Status: DC | PRN

## 2017-07-16 NOTE — Patient Instructions (Addendum)
We are checking you cholesterol today.  If it is high enough to need medication, Dr Chioma Mukherjee will call you.  If it does not need medicine,then he will send you a letter with the results.   Try taking ibuprofen 4 tablets when you have a headache.   Your blood pressure looks good.  You are overweight.  You may want to decrease the amount of hot dogs you eat.   Dr Denesia Donelan will call in the nicotine gum to help you quit smoking tobacco.   The medicine for your hemorrhoids was sent to pharmacy.  There is a powdered medicine that you mix and drink daily to keep your bowel movements soft. Taking this will help prevent hemorrhoids.

## 2017-07-17 ENCOUNTER — Other Ambulatory Visit: Payer: Self-pay

## 2017-07-17 ENCOUNTER — Encounter: Payer: Self-pay | Admitting: Family Medicine

## 2017-07-17 DIAGNOSIS — F17200 Nicotine dependence, unspecified, uncomplicated: Secondary | ICD-10-CM

## 2017-07-17 LAB — LIPID PANEL
CHOL/HDL RATIO: 4.7 ratio (ref 0.0–5.0)
Cholesterol, Total: 173 mg/dL (ref 100–199)
HDL: 37 mg/dL — ABNORMAL LOW (ref >39)
LDL Calculated: 118 mg/dL — ABNORMAL HIGH (ref 0–99)
Triglycerides: 91 mg/dL (ref 0–149)
VLDL Cholesterol Cal: 18 mg/dL (ref 5–40)

## 2017-07-17 IMAGING — CR DG ABDOMEN 1V
1 series · 1 of 1 positions shown · non-contrast
Comparison: CT abdomen and pelvis December 23, 2012

CLINICAL DATA: Abdominal pain, chronic

EXAM:
ABDOMEN - 1 VIEW

[abdomen kub]
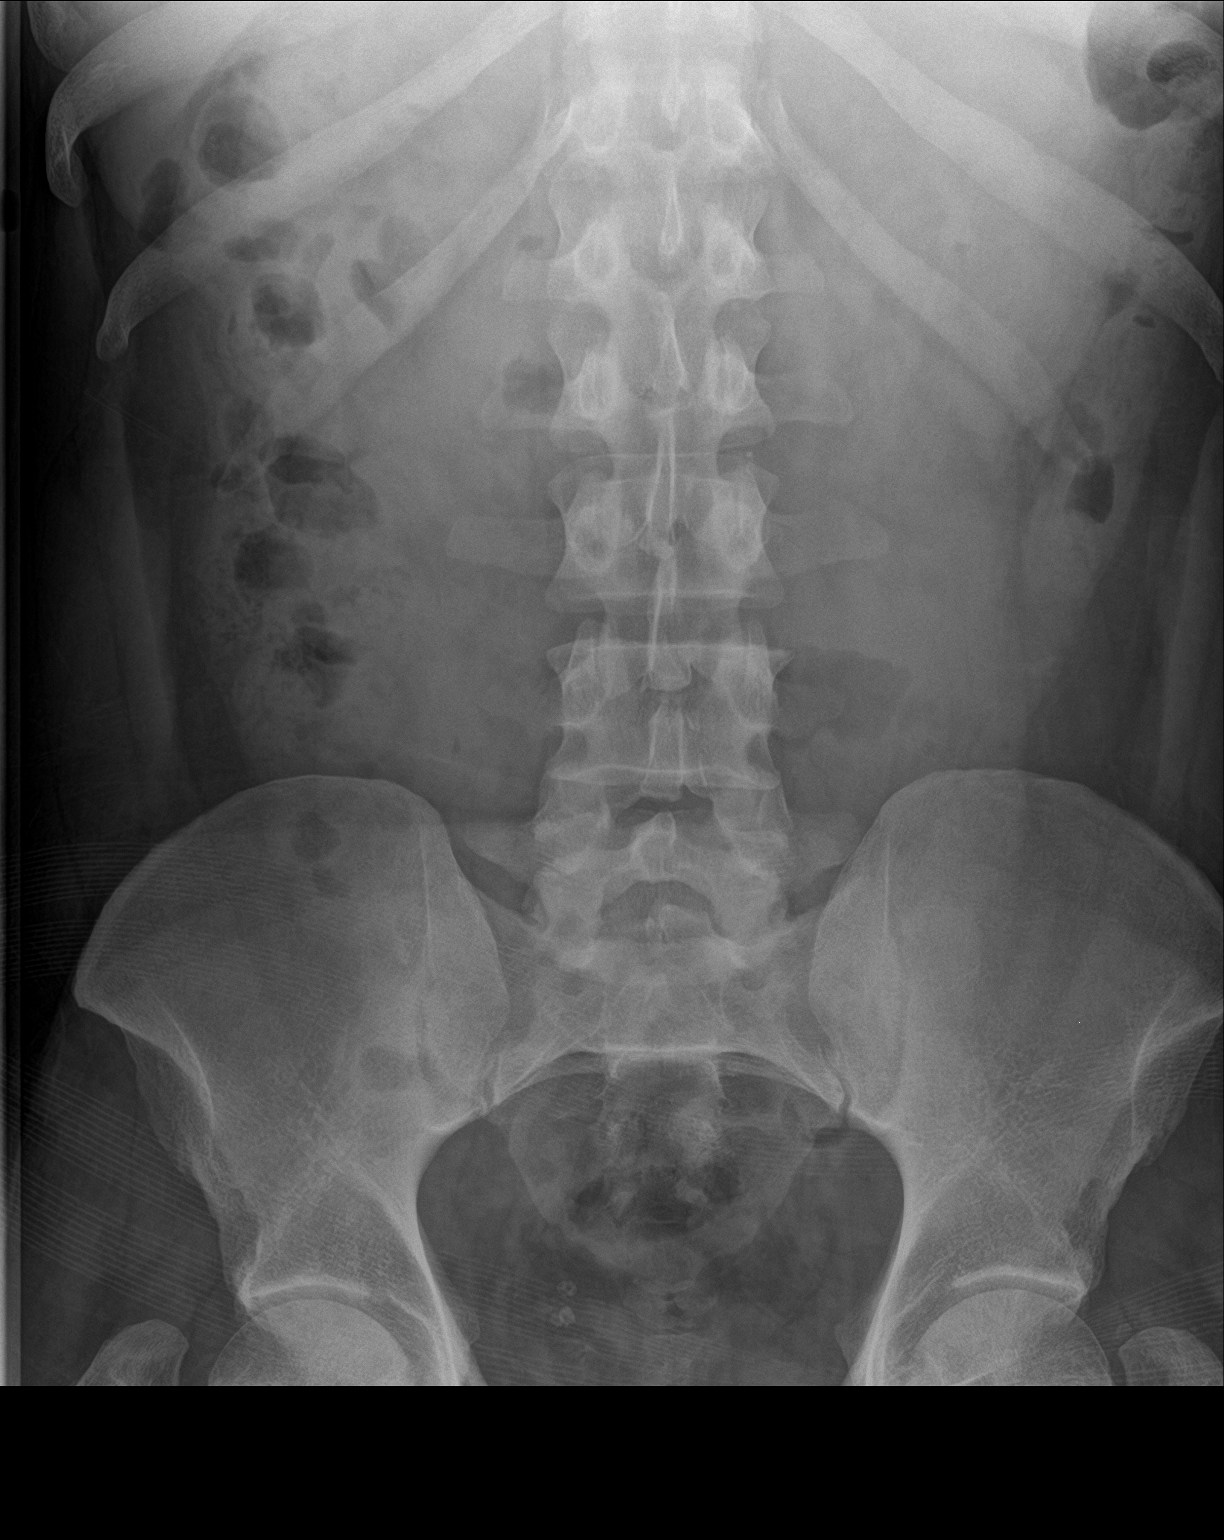

[1 of 1 positions shown; findings below may reference images not displayed]

FINDINGS: There is moderate stool throughout the colon. There is no bowel
dilatation or air-fluid level suggesting obstruction. No free air is
seen on this supine examination. There are apparent phleboliths in
the right pelvis. No bony lesions appreciable.
IMPRESSION: Bowel gas pattern unremarkable. No demonstrable bowel obstruction or
free air on this supine examination.

## 2017-07-17 NOTE — Assessment & Plan Note (Signed)
Established problem Controlled Continue current therapy regiment.  

## 2017-07-17 NOTE — Assessment & Plan Note (Signed)
Established problem. Stable. Small steps in diet: recommend reducing amount of processed meat eaten (esp hot dogs)

## 2017-07-17 NOTE — Progress Notes (Signed)
   Subjective:    Patient ID: Derrick Ortiz, male    DOB: 10-20-1970, 47 y.o.   MRN: [id number] DEVERICK PRUSS is alone Sources of clinical information for visit is/are patient and past medical records. Nursing assessment for this office visit was reviewed with the patient for accuracy and revision.  Previous Report(s) Reviewed: none  Depression screen PHQ 2/9 12/19/2016  Decreased Interest 0  Down, Depressed, Hopeless 0  PHQ - 2 Score 0  Altered sleeping -  Tired, decreased energy -  Change in appetite -  Feeling bad or failure about yourself  -  Trouble concentrating -  Moving slowly or fidgety/restless -  Suicidal thoughts -  PHQ-9 Score -   No flowsheet data found.  HPI  Problem List Items Addressed This Visit      Low   CIGARETTE SMOKER - smoking 5 cig daily. Lites - low faegerstrom - has quit successfully in past      Unprioritized   OBESITY - Divorced -  Cooking for himself High fat and carbohydrate   Migraine without aura - Occasional, once a month, usually reponds to Advil   Hemorrhoids, external without complications - Recurrent Active now, not painful.    Relevant Medications   polyethylene glycol powder (GLYCOLAX/MIRALAX) powder   pramoxine-hydrocortisone (PROCTOCREAM-HC) 1-1 % rectal cream   GERD (gastroesophageal reflux disease) - intermittent HB    Relevant Medications   polyethylene glycol powder (GLYCOLAX/MIRALAX) powder   omeprazole (PRILOSEC) 20 MG capsule    Other Visit Diagnoses    Screening cholesterol level    -  Primary   Relevant Orders   Lipid Panel (Completed)   External hemorrhoids       Relevant Medications   pramoxine-hydrocortisone (PROCTOCREAM-HC) 1-1 % rectal cream   Tobacco dependence       Relevant Medications   nicotine polacrilex (NICORETTE) 4 MG gum     SH: recently divorced.  Living alone. Sees his son, Makih on weekends.   Review of Systems See HPI    Objective:   Physical Exam  VS reviewed GEN: Alert,  Cooperative, Groomed, NAD HEENT: PERRL; EAC bilaterally not occluded, TM's translucent with normal LM, (+) LR;                No cervical LAN, No thyromegaly, No palpable masses COR: RRR, No M/G/R, No JVD, Normal PMI size and location LUNGS: BCTA, No Acc mm use, speaking in full sentences ABDOMEN: (+)BS, soft, NT, ND, No HSM, No palpable masses EXT: No peripheral leg edema.  Neuro:Rhomberg negative Gait: Normal speed, No significant path deviation, Step through +,  Psych: Normal affect/thought/speech/language     Assessment & Plan:

## 2017-07-17 NOTE — Assessment & Plan Note (Signed)
Reattempt cessation Nicotene gum 4 mg Rx Discussed how to use

## 2017-07-17 NOTE — Telephone Encounter (Signed)
Fax from [facility] came through requesting additional information for pts pended prescription. Pharmacy needs a daily limit for the gum. Please advise.

## 2017-07-17 NOTE — Assessment & Plan Note (Signed)
Established problem. Stable. Continue current therapy with triptan for breakthru pain after trying Advil.

## 2017-07-20 MED ORDER — NICOTINE POLACRILEX 4 MG MT GUM: 4.0000 mg | CHEWING_GUM | OROMUCOSAL | 0 refills | Status: DC | PRN

## 2017-08-19 ENCOUNTER — Other Ambulatory Visit: Payer: Self-pay

## 2017-08-19 ENCOUNTER — Ambulatory Visit (HOSPITAL_COMMUNITY)
Admission: RE | Admit: 2017-08-19 | Discharge: 2017-08-19 | Disposition: A | Discharge status: Home / Self Care | Payer: 59 | Source: Ambulatory Visit | Attending: Family Medicine | Admitting: Family Medicine

## 2017-08-19 ENCOUNTER — Ambulatory Visit (INDEPENDENT_AMBULATORY_CARE_PROVIDER_SITE_OTHER): Payer: 59 | Admitting: Student in an Organized Health Care Education/Training Program

## 2017-08-19 DIAGNOSIS — R52 Pain, unspecified: Secondary | ICD-10-CM | POA: Diagnosis not present

## 2017-08-19 DIAGNOSIS — M5030 Other cervical disc degeneration, unspecified cervical region: Secondary | ICD-10-CM | POA: Insufficient documentation

## 2017-08-19 DIAGNOSIS — M542 Cervicalgia: Secondary | ICD-10-CM | POA: Diagnosis present

## 2017-08-19 MED ORDER — CYCLOBENZAPRINE HCL 10 MG PO TABS: 10.0000 mg | ORAL_TABLET | Freq: Every evening | ORAL | 0 refills | Status: DC | PRN
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
## 2017-08-19 NOTE — Assessment & Plan Note (Signed)
Unable to clear C-spine given that patient will not turn head for full ROM. Exam seems to be limited due to pain. He is not endorsing severe headaches or confusion and his CN exam is WNL. He may have a concussion, however given that he is not on any blood thinners, appears well, and is behaving normally, low suspicion for subdural bleed at this time.  I would expect him to appear much more uncomfortable with a subarachnoid bleed. - XR C-spine given limited exam - once C-spine is cleared, will order flexeril for suspected muscle spasms in neck and lumbar spine. Advised not to take flexeril before driving - OTC pain meds, tylenol/ibuprofen for pain - strict return precautions discussed - if headache worsens or fails to improve, low threshold to check CT head
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
## 2017-08-25 DIAGNOSIS — M542 Cervicalgia: Secondary | ICD-10-CM | POA: Insufficient documentation

## 2017-08-25 NOTE — Assessment & Plan Note (Signed)
DG cervical spine as noted above.
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
## 2018-01-01 ENCOUNTER — Ambulatory Visit (INDEPENDENT_AMBULATORY_CARE_PROVIDER_SITE_OTHER): Payer: 59 | Admitting: Family Medicine

## 2018-01-01 ENCOUNTER — Encounter: Payer: Self-pay | Admitting: Family Medicine

## 2018-01-01 ENCOUNTER — Other Ambulatory Visit: Payer: Self-pay

## 2018-01-01 VITALS — BP 118/76 | HR 79 | Temp 98.2°F | Ht 71.0 in | Wt 208.0 lb

## 2018-01-01 DIAGNOSIS — H93A1 Pulsatile tinnitus, right ear: Secondary | ICD-10-CM | POA: Diagnosis not present

## 2018-01-01 NOTE — Patient Instructions (Signed)
It was great meeting you today! I think given the pulsatile heartbeat in your ear, and your ulceration without any obvious trauma to your ear I would like for you to go see an ear specialist. I have referred you to the ear specialist in [place], Dr. Thornell Mule. Please come back and see me or please let me know if you have any issues with the referral.

## 2018-01-05 ENCOUNTER — Encounter: Payer: Self-pay | Admitting: Family Medicine

## 2018-01-05 DIAGNOSIS — H93A1 Pulsatile tinnitus, right ear: Secondary | ICD-10-CM

## 2018-01-05 HISTORY — DX: Pulsatile tinnitus, right ear: H93.A1

## 2018-01-05 NOTE — Assessment & Plan Note (Signed)
Appear of ulceration without any (claimed) trauma to the ear is strange. More concerning still is the history of a pulsatile beating in the patient's right ear. Discussed case with Attending, dr. Wendy Poet and mutual conclusion to refer to ear specialist reached. - refer to ENT

## 2018-01-05 NOTE — Progress Notes (Signed)
   HPI 47 year old male who presents with multi-month history of pulsatile "heart-beating  in right ear. He states that he has also been having intermittent pain "deep" inside that ear that he says is a sharp pain. He also noticed a few drops of blood coming out of his ear a few days prior to his visit which prompted his visit to clinic. He has tried nothing to relieve the pain. He states that he has not tried to clean out his ears, and does not think he traumatized his ear in any way.  Patient works for the city, [employer], [employer], [employer]. He does not think he got anything in his ear, and does not feel as though he was exposed fungi or bacteria that could account for his symptoms.  CC: ear pain   ROS:   Review of Systems See HPI for ROS.   CC, SH/smoking status, and VS noted  Objective: BP 118/76   Pulse 79   Temp 98.2 F (36.8 C) (Oral)   Ht 5\' 11"  (1.803 m)   Wt 208 lb (94.3 kg)   SpO2 98%   BMI 29.01 kg/m  Gen: NAD, alert, cooperative, and pleasant. Well appearing AA male HEENT: NCAT, EOMI, PERRL.  R ear: Patient with small ulceration of proximal ear canal. Hemostatic with circular base. TM intact, no abnormality noted CV: RRR, no murmur Resp: CTAB, no wheezes, non-labored Abd: SNTND, BS present, no guarding or organomegaly Ext: No edema, warm Neuro: Alert and oriented, Speech clear, No gross deficits   Assessment and plan:  Pulsatile tinnitus, right ear Appear of ulceration without any (claimed) trauma to the ear is strange. More concerning still is the history of a pulsatile beating in the patient's right ear. Discussed case with Attending, dr. Wendy Poet and mutual conclusion to refer to ear specialist reached. - refer to ENT   Orders Placed This Encounter  Procedures  . Ambulatory referral to ENT    Referral Priority:   Routine    Referral Type:   Consultation    Referral Reason:   Specialty Services Required    Referred to Provider:   Vicie Mutters, MD    Requested Specialty:   Otolaryngology    Number of Visits Requested:   1    No orders of the defined types were placed in this encounter.    Guadalupe Dawn MD PGY-2 Family Medicine Resident  01/05/2018 10:50 PM

## 2018-01-14 ENCOUNTER — Other Ambulatory Visit: Payer: Self-pay

## 2018-01-14 ENCOUNTER — Ambulatory Visit (INDEPENDENT_AMBULATORY_CARE_PROVIDER_SITE_OTHER): Payer: 59 | Admitting: Family Medicine

## 2018-01-14 VITALS — BP 124/60 | HR 110 | Temp 98.3°F | Ht 71.0 in | Wt 211.0 lb

## 2018-01-14 DIAGNOSIS — L309 Dermatitis, unspecified: Secondary | ICD-10-CM

## 2018-01-14 DIAGNOSIS — H60541 Acute eczematoid otitis externa, right ear: Secondary | ICD-10-CM

## 2018-01-14 MED ORDER — NEOMYCIN-POLYMYXIN-HC 3.5-10000-1 OT SOLN: 4.0000 [drp] | Freq: Three times a day (TID) | OTIC | 0 refills | Status: DC

## 2018-01-14 MED ORDER — TRIAMCINOLONE ACETONIDE 0.1 % EX CREA: 1.0000 "application " | TOPICAL_CREAM | Freq: Two times a day (BID) | CUTANEOUS | 0 refills | Status: DC

## 2018-01-14 NOTE — Patient Instructions (Signed)
Put 4 drops of ear drop medicine into your right ear three times a day for next two  Weeks.    Come back for Dr Edrie Ehrich to look in your ear to see if the sore spot is gone.  If it is not gone then Dr Sharna Gabrys will make referral to Ear, Nose and Throat doctor.

## 2018-01-15 ENCOUNTER — Encounter: Payer: Self-pay | Admitting: Family Medicine

## 2018-01-15 DIAGNOSIS — H60541 Acute eczematoid otitis externa, right ear: Secondary | ICD-10-CM | POA: Insufficient documentation

## 2018-01-15 HISTORY — DX: Acute eczematoid otitis externa, right ear: H60.541

## 2018-01-15 NOTE — Progress Notes (Signed)
   Subjective:    Patient ID: Derrick Ortiz, male    DOB: 03-14-1971, 47 y.o.   MRN: [id number] Derrick Ortiz is alone Sources of clinical information for visit is/are patient and past medical records. Nursing assessment for this office visit was reviewed with the patient for accuracy and revision.  Previous Report(s) Reviewed: historical medical records  Depression screen [facility] 2/9 01/14/2018  Decreased Interest 0  Down, Depressed, Hopeless 0  PHQ - 2 Score 0  Altered sleeping -  Tired, decreased energy -  Change in appetite -  Feeling bad or failure about yourself  -  Trouble concentrating -  Moving slowly or fidgety/restless -  Suicidal thoughts -  PHQ-9 Score -   No flowsheet data found.  HPI  Ear sore - Seen [facility] by Dr Kris Mouton 01/01/18 for months of pulsatile tinnitus in right ear as well as pain in right ear and few drops of blood from right ear canal.  - Denies sticking things into ear canal.  - No fever, no loss of hearing.  No further tinnitus, blood from ear. No ear pain- He was referred to ENT by Dr  Kris Mouton, but pt has not heard anything back about referral.    Review of Systems No headache No dizziness    Objective:   Physical Exam  HENT:  Ears:    VS reviewed GEN: Alert, Cooperative, Groomed, NAD HEENT: PERRL; EAC bilaterally not occluded, TM's translucent with normal LM, (+) LR;                No cervical LAN, No thyromegaly, No palpable masses                Eczematous external meatus skin on right.  Superficial ulceration/defect at anteroinferior R EAC meatus.  No evidence of bleeding.          Assessment & Plan:

## 2018-01-15 NOTE — Assessment & Plan Note (Signed)
Follow up of new problem There has been resolution of tinnitus, EAC bleed, and R. Otalgia Eczematous changed right ear proximal EAC with 2 to 3 mm very superficial ulcer vs erosion on floor of very proximal meatus.  Will try trial of Cortisporin otic drops for 2 weeks.  RTC in 2 weeks to assess response of skin and lesion.  If persists, then may refer to ENT.

## 2018-04-28 ENCOUNTER — Other Ambulatory Visit: Payer: Self-pay

## 2018-04-28 ENCOUNTER — Other Ambulatory Visit (HOSPITAL_COMMUNITY)
Admission: RE | Admit: 2018-04-28 | Discharge: 2018-04-28 | Disposition: A | Discharge status: Home / Self Care | Payer: 59 | Source: Ambulatory Visit | Attending: Family Medicine | Admitting: Family Medicine

## 2018-04-28 ENCOUNTER — Ambulatory Visit (INDEPENDENT_AMBULATORY_CARE_PROVIDER_SITE_OTHER): Payer: 59 | Admitting: Family Medicine

## 2018-04-28 VITALS — BP 128/80 | HR 93 | Temp 98.5°F | Ht 71.0 in | Wt 219.4 lb

## 2018-04-28 DIAGNOSIS — Z202 Contact with and (suspected) exposure to infections with a predominantly sexual mode of transmission: Secondary | ICD-10-CM

## 2018-04-28 DIAGNOSIS — R300 Dysuria: Secondary | ICD-10-CM | POA: Insufficient documentation

## 2018-04-28 HISTORY — DX: Contact with and (suspected) exposure to infections with a predominantly sexual mode of transmission: Z20.2

## 2018-04-28 NOTE — Progress Notes (Signed)
  Subjective:   Patient ID: Derrick Ortiz    DOB: 1971/01/17, 47 y.o. male   MRN: [id number]  Derrick Ortiz is a 47 y.o. male with a history of migraine, GERD, eczema, obesity, tobacco use, PTSD, ED, anxiety here for   Exposure to STI Patient reports he was told by a sexual partner to be checked for STI.  Has had some dysuria that comes and goes starting about a week ago.  Does report "a little stickiness" discharge from his penis.  Denies any genital wounds, rashes, ulcers.  Denies fevers.  Has previously had STIs in the past including gonorrhea and chlamydia.  Does wear condoms but not always.  Reports about 3 male partners in the last month.  Review of Systems:  Per HPI.  [place], medications and smoking status reviewed.  Objective:   BP 128/80   Pulse 93   Temp 98.5 F (36.9 C) (Oral)   Ht 5\' 11"  (1.803 m)   Wt 219 lb 6.4 oz (99.5 kg)   SpO2 98%   BMI 30.60 kg/m  Vitals and nursing note reviewed.  General: Obese male, in no acute distress with non-toxic appearance CV: regular rate Lungs:  normal work of breathing Skin: warm, dry, no rashes or lesions Extremities: warm and well perfused, normal tone MSK: ROM grossly intact, strength intact, gait normal Neuro: Alert and oriented, speech normal  Assessment & Plan:   Exposure to STD Obtained urine cytology today.  Patient declined RPR, HIV.  Will call with results and treat as indicated.  No orders of the defined types were placed in this encounter.  No orders of the defined types were placed in this encounter.   Rory Percy, DO PGY-2, [facility] 04/28/2018 5:25 PM

## 2018-04-28 NOTE — Assessment & Plan Note (Signed)
Obtained urine cytology today.  Patient declined RPR, HIV.  Will call with results and treat as indicated.

## 2018-05-04 ENCOUNTER — Telehealth: Payer: Self-pay

## 2018-05-04 LAB — URINE CYTOLOGY ANCILLARY ONLY
CHLAMYDIA, DNA PROBE: POSITIVE — AB
Neisseria Gonorrhea: NEGATIVE
TRICH (WINDOWPATH): POSITIVE — AB

## 2018-05-04 NOTE — Telephone Encounter (Signed)
Contacted patient and scheduled him a nurse visit for 12/6 for std treatment.

## 2018-05-04 NOTE — Telephone Encounter (Signed)
-----   Message from Rory Percy, DO sent at 05/04/2018  1:46 PM EST ----- Spoke with patient regarding results. Instructed to come to clinic to receive metronidazole 2g and azithromycin 1g under direct supervision. Instructed to inform partners to receive treatment and discussed safe sex practices.

## 2018-05-06 NOTE — Telephone Encounter (Signed)
Health department form started and placed in RN box to be completed after patient has been treated.  Karalynn Cottone,CMA

## 2018-05-07 ENCOUNTER — Ambulatory Visit (INDEPENDENT_AMBULATORY_CARE_PROVIDER_SITE_OTHER): Payer: 59 | Admitting: *Deleted

## 2018-05-07 DIAGNOSIS — Z202 Contact with and (suspected) exposure to infections with a predominantly sexual mode of transmission: Secondary | ICD-10-CM

## 2018-05-07 MED ORDER — AZITHROMYCIN 500 MG PO TABS
1000.0000 mg | ORAL_TABLET | Freq: Once | ORAL | Status: AC
  Administered 2018-05-07: 1000 mg via ORAL

## 2018-05-07 MED ORDER — METRONIDAZOLE 500 MG PO TABS
2000.0000 mg | ORAL_TABLET | Freq: Once | ORAL | Status: AC
  Administered 2018-05-07: 2000 mg via ORAL

## 2018-05-07 NOTE — Progress Notes (Signed)
Patient in nurse clinic today for STD treatment of Trichomoniasis and Chlamydia treatment.  Patient advised to abstain from sex for 7-10 days after treatment or when partner has been tested/treated.  Azithromycin 1 GM PO x 1 given and Metronidazole 2 GM PO x 1 per Dr. Mary Sella orders.  Advised to use condoms with all sexual activity.  STD report form fax completed and faxed to [facility] Department at [phone] (STD department).  Patient verbalized understanding.  Fitzhugh Vizcarrondo, Salome Spotted, CMA

## 2018-06-17 ENCOUNTER — Ambulatory Visit (INDEPENDENT_AMBULATORY_CARE_PROVIDER_SITE_OTHER): Payer: 59 | Admitting: Family Medicine

## 2018-06-17 ENCOUNTER — Encounter: Payer: Self-pay | Admitting: Family Medicine

## 2018-06-17 ENCOUNTER — Other Ambulatory Visit (HOSPITAL_COMMUNITY)
Admission: RE | Admit: 2018-06-17 | Discharge: 2018-06-17 | Disposition: A | Discharge status: Home / Self Care | Payer: 59 | Source: Ambulatory Visit | Attending: Family Medicine | Admitting: Family Medicine

## 2018-06-17 VITALS — BP 138/74 | HR 101 | Temp 98.1°F | Ht 71.0 in | Wt 215.0 lb

## 2018-06-17 DIAGNOSIS — R319 Hematuria, unspecified: Secondary | ICD-10-CM | POA: Insufficient documentation

## 2018-06-17 DIAGNOSIS — A749 Chlamydial infection, unspecified: Secondary | ICD-10-CM

## 2018-06-17 DIAGNOSIS — Z202 Contact with and (suspected) exposure to infections with a predominantly sexual mode of transmission: Secondary | ICD-10-CM | POA: Diagnosis not present

## 2018-06-17 DIAGNOSIS — A599 Trichomoniasis, unspecified: Secondary | ICD-10-CM

## 2018-06-17 DIAGNOSIS — Z7251 High risk heterosexual behavior: Secondary | ICD-10-CM

## 2018-06-17 LAB — POCT URINALYSIS DIP (MANUAL ENTRY)
Bilirubin, UA: NEGATIVE
GLUCOSE UA: NEGATIVE mg/dL
Ketones, POC UA: NEGATIVE mg/dL
Leukocytes, UA: NEGATIVE
NITRITE UA: NEGATIVE
PROTEIN UA: NEGATIVE mg/dL
SPEC GRAV UA: 1.02 (ref 1.010–1.025)
UROBILINOGEN UA: 1 U/dL
pH, UA: 7 (ref 5.0–8.0)

## 2018-06-17 LAB — POCT UA - MICROSCOPIC ONLY

## 2018-06-17 NOTE — Progress Notes (Signed)
Blood in Urine  HPI: Onset: last week, bright red urine episode Pain with urination: no Pain with blood in urine: no Blood Clots in urine: no Abdomin Pain: right sided lateral pain that has been present for few yrs Pelvic Pain: no  Prior history of blood in urine: Yes,   12/20/12 [place] ov for urinary freq and c/o hematuria, urine micro showed 10-15 RBC ==> sent for CT renal protocol that did not show evidence of  kidney/ureteral/bladder stone  01/17/13 [facility] ov concern for two prior (+) microscopic hematuria in smoker.  Referral to urology (02/08/13, Dr B. Herrick ==> Urine cytology showed atypical uroepithelial cells; Cystocopy showed erythematous patch on bladder mucosa at trigon.  Bx sent.  Results not available in our [facility]   History of kidney stones: no History of cystitis / pyelonephritis: no History GC/chlamydia/trich: 04/28/18 [place] ov for dysuria and high risk sexual behavior- multiple partners.  Dx with Chlamydia and Trichomonas on urine. 05/07/18 Patient received metronidazole 2g PO and azithromycin 1g PO under direct supervision at [facility].   Patient concerned that he vomited later that day and is uncertain if he retained the oral medications doses.    Imaging of abdomen/pelvis: Last CT AP 08/31/15 without evidence of abnormal KUBs Anticoagulation / Antiplatelet therapies: no  Smoking history: started age 28, heaviest , pack, curently 5 cig a day Occupations: laborer, works for [employer] Surgeries: cholecystectomy, cystoscopy, inguinal hermia repair, ventral hernia repair, femoral aretery repair, ORIF femor fracture Prior Urinalysis / ur3ine culture results: three Urinalysis/micro without evidence abnormal count RBCs since 03/22/13  ROS No recent head or chest colds.  Father may have Family History of kidney disease / stones. no history of therapeutic radiation exposure.  no chemotherapy exposures  Once a month OTC NSAID use.  More frequently NSAID use  when younger.  No recent vigorous exercise.  Works as Arts administrator for city. no dysuria, urgency or frequency.  No  nocturia.  no new joint pain or swelling.  no fevers or chills.  No weight loss. No oral lesions.  no rashes.    SH: (+) smoker  ROS See HPI   P.E. VS reviewed GEN: Alert, Cooperative, Groomed, NAD COR: RRR, No M/G/R, No JVD, Normal PMI size and location LUNGS: BCTA, No Acc mm use, speaking in full sentences ABDOMEN: (+)BS, soft, m,ild TTP right lateral abdomen, ND, No HSM, No palpable masses SKIN: No lesion nor rashes of face/trunk/extremities Gait: Normal speed, No significant path deviation, Step through +,  Psych: Normal affect/thought/speech/language  A/P Visit Problem List with A/P  No problem-specific Assessment & Plan notes found for this encounter.

## 2018-06-17 NOTE — Patient Instructions (Signed)
Derrick Ortiz,  We need to make sure that the blood you saw in your urine was not from a cancer in your kidneys or bladder.   We will schedule you for a CT of your abdomin and pelvis once we have your blood work back.   We will make you an appointment with a Urologist doctor who may want to use a camera to look into your bladder to make sure there is no cancer in it.    We are rechecking you for Gonorrhea, chlamydia, HIV and Syphilis.      Hematuria, Adult Hematuria is blood in the urine. Blood may be visible in the urine, or it may be identified with a test. This condition can be caused by infections of the bladder, urethra, kidney, or prostate. Other possible causes include:  Kidney stones.  Cancer of the urinary tract.  Too much calcium in the urine.  Conditions that are passed from parent to child (inherited conditions).  Exercise that requires a lot of energy. Infections can usually be treated with medicine, and a kidney stone usually will pass through your urine. If neither of these is the cause of your hematuria, more tests may be needed to identify the cause of your symptoms. It is very important to tell your health care provider about any blood in your urine, even if it is painless or the blood stops without treatment. Blood in the urine, when it happens and then stops and then happens again, can be a symptom of a very serious condition, including cancer. There is no pain in the initial stages of many urinary cancers. Follow these instructions at home: Medicines  Take over-the-counter and prescription medicines only as told by your health care provider.  If you were prescribed an antibiotic medicine, take it as told by your health care provider. Do not stop taking the antibiotic even if you start to feel better. Eating and drinking  Drink enough fluid to keep your urine clear or pale yellow. It is recommended that you drink 3-4 quarts (2.8-3.8 L) a day. If you have been diagnosed  with an infection, it is recommended that you drink cranberry juice in addition to large amounts of water.  Avoid caffeine, tea, and carbonated beverages. These tend to irritate the bladder.  Avoid alcohol because it may irritate the prostate (men). General instructions  If you have been diagnosed with a kidney stone, follow your health care provider's instructions about straining your urine to catch the stone.  Empty your bladder often. Avoid holding urine for long periods of time.  If you are male: ? After a bowel movement, wipe from front to back and use each piece of toilet paper only once. ? Empty your bladder before and after sex.  Pay attention to any changes in your symptoms. Tell your health care provider about any changes or any new symptoms.  It is your responsibility to get your test results. Ask your health care provider, or the department performing the test, when your results will be ready.  Keep all follow-up visits as told by your health care provider. This is important. Contact a health care provider if:  You develop back pain.  You have a fever.  You have nausea or vomiting.  Your symptoms do not improve after 3 days.  Your symptoms get worse. Get help right away if:  You develop severe vomiting and are unable take medicine without vomiting.  You develop severe pain in your back or abdomen even though you  are taking medicine.  You pass a large amount of blood in your urine.  You pass blood clots in your urine.  You feel very weak or like you might faint.  You faint. Summary  Hematuria is blood in the urine. It has many possible causes.  It is very important that you tell your health care provider about any blood in your urine, even if it is painless or the blood stops without treatment.  Take over-the-counter and prescription medicines only as told by your health care provider.  Drink enough fluid to keep your urine clear or pale yellow. This  information is not intended to replace advice given to you by your health care provider. Make sure you discuss any questions you have with your health care provider. Document Released: 05/19/2005 Document Revised: 06/21/2016 Document Reviewed: 06/21/2016 Elsevier Interactive Patient Education  2019 [employer].

## 2018-06-18 LAB — CBC
HEMATOCRIT: 46.5 % (ref 37.5–51.0)
HEMOGLOBIN: 15.9 g/dL (ref 13.0–17.7)
MCH: 30.9 pg (ref 26.6–33.0)
MCHC: 34.2 g/dL (ref 31.5–35.7)
MCV: 90 fL (ref 79–97)
Platelets: 283 10*3/uL (ref 150–450)
RBC: 5.15 x10E6/uL (ref 4.14–5.80)
RDW: 13.6 % (ref 11.6–15.4)
WBC: 5.2 10*3/uL (ref 3.4–10.8)

## 2018-06-18 LAB — BASIC METABOLIC PANEL
BUN/Creatinine Ratio: 15 (ref 9–20)
BUN: 13 mg/dL (ref 6–24)
CALCIUM: 9.6 mg/dL (ref 8.7–10.2)
CO2: 22 mmol/L (ref 20–29)
CREATININE: 0.88 mg/dL (ref 0.76–1.27)
Chloride: 103 mmol/L (ref 96–106)
GFR, EST AFRICAN AMERICAN: 119 mL/min/{1.73_m2} (ref >59)
GFR, EST NON AFRICAN AMERICAN: 103 mL/min/{1.73_m2} (ref >59)
Glucose: 92 mg/dL (ref 65–99)
Potassium: 4.5 mmol/L (ref 3.5–5.2)
SODIUM: 139 mmol/L (ref 134–144)

## 2018-06-18 LAB — URINE CYTOLOGY ANCILLARY ONLY
Chlamydia: NEGATIVE
Neisseria Gonorrhea: NEGATIVE
TRICH (WINDOWPATH): NEGATIVE

## 2018-06-18 LAB — RPR: RPR: NONREACTIVE

## 2018-06-18 LAB — HIV ANTIBODY (ROUTINE TESTING W REFLEX): HIV Screen 4th Generation wRfx: NONREACTIVE

## 2018-06-21 ENCOUNTER — Encounter: Payer: Self-pay | Admitting: Family Medicine

## 2018-06-21 DIAGNOSIS — A599 Trichomoniasis, unspecified: Secondary | ICD-10-CM

## 2018-06-21 DIAGNOSIS — A749 Chlamydial infection, unspecified: Secondary | ICD-10-CM

## 2018-06-21 HISTORY — DX: Trichomoniasis, unspecified: A59.9

## 2018-06-21 HISTORY — DX: Chlamydial infection, unspecified: A74.9

## 2018-06-21 NOTE — Assessment & Plan Note (Signed)
Treated 05/07/18 at [facility] Direct Observation 2 gram PO Flagyl Urine test trich 06/17/18 was negative Retest in March 2020

## 2018-06-21 NOTE — Assessment & Plan Note (Addendum)
Recurrent issue for patient.  Dr Jearl Klinefelter ([facility]) evaluated in 01/2018.   Urinalysis/micro today did not show abnormal RBC count. No evidence of infection.  SCr 0.88   Repeat Urine GC/Chlamydia/Trich today without evidence of infection. Ddx for reported of gross hematuria: mistaken perception by patient, a postinfectious urethritis after treated Chlamydia/trich infection, exertional hematuria in a laborer,  possible RCC or uroepithelial malignancy, less likely stone or cystitis.  Doubt GN.    06/17/18 Serum Creatinine 0.88 06/17/18 Urinalysis unremarkable & Micro was bland sediment only 06/25/18 CT AP with and without IV was unremarkable for renal/GU cause of hematuria 06/17/18 Urine PCR were negative for evidence of GC/Chlamydia and Trichomonas            Referral to Dr Louis Meckel [facility]) who has seen pt before for same complaint of hematuria a few years ago.

## 2018-06-21 NOTE — Assessment & Plan Note (Addendum)
Treated 05/07/18 with direct observe 1 gram Azithromycin PO at [facility] Urine Chlamydia test 06/17/18 negative Repeat test in March 2020

## 2018-06-25 ENCOUNTER — Ambulatory Visit
Admission: RE | Admit: 2018-06-25 | Discharge: 2018-06-25 | Disposition: A | Discharge status: Home / Self Care | Payer: 59 | Source: Ambulatory Visit | Attending: Family Medicine | Admitting: Family Medicine

## 2018-06-25 DIAGNOSIS — R319 Hematuria, unspecified: Secondary | ICD-10-CM | POA: Diagnosis not present

## 2018-06-25 DIAGNOSIS — R109 Unspecified abdominal pain: Secondary | ICD-10-CM

## 2018-06-25 DIAGNOSIS — G8929 Other chronic pain: Secondary | ICD-10-CM

## 2018-06-25 DIAGNOSIS — I700 Atherosclerosis of aorta: Secondary | ICD-10-CM

## 2018-06-25 MED ORDER — IOPAMIDOL (ISOVUE-300) INJECTION 61%
125.0000 mL | Freq: Once | INTRAVENOUS | Status: AC | PRN
  Administered 2018-06-25: 125 mL via INTRAVENOUS

## 2018-06-28 ENCOUNTER — Telehealth: Payer: Self-pay | Admitting: Family Medicine

## 2018-06-28 ENCOUNTER — Encounter: Payer: Self-pay | Admitting: Family Medicine

## 2018-06-28 DIAGNOSIS — I700 Atherosclerosis of aorta: Secondary | ICD-10-CM | POA: Insufficient documentation

## 2018-06-28 NOTE — Telephone Encounter (Signed)
HOme Telephone number is Mr Hosking's Ex-wife's telephone number.   She confirmed that the mobile phone number the [facility] has for Mr Fishbaugh is current.

## 2018-08-09 DIAGNOSIS — R1084 Generalized abdominal pain: Secondary | ICD-10-CM | POA: Diagnosis not present

## 2018-08-09 DIAGNOSIS — R311 Benign essential microscopic hematuria: Secondary | ICD-10-CM | POA: Diagnosis not present

## 2018-08-11 ENCOUNTER — Other Ambulatory Visit: Payer: Self-pay | Admitting: Urology

## 2018-09-03 ENCOUNTER — Inpatient Hospital Stay (HOSPITAL_COMMUNITY): Admission: RE | Admit: 2018-09-03 | Payer: 59 | Source: Ambulatory Visit

## 2018-09-16 ENCOUNTER — Ambulatory Visit (HOSPITAL_COMMUNITY): Admission: RE | Admit: 2018-09-16 | Payer: 59 | Source: Home / Self Care | Admitting: Urology

## 2018-09-16 ENCOUNTER — Encounter (HOSPITAL_COMMUNITY): Admission: RE | Payer: Self-pay | Source: Home / Self Care

## 2018-09-16 SURGERY — CYSTOURETEROSCOPY, WITH RETROGRADE PYELOGRAM AND STENT INSERTION
Anesthesia: General | Laterality: Bilateral

## 2018-10-06 ENCOUNTER — Other Ambulatory Visit: Payer: Self-pay | Admitting: Urology

## 2018-10-07 ENCOUNTER — Other Ambulatory Visit: Payer: Self-pay

## 2018-10-07 ENCOUNTER — Encounter (HOSPITAL_BASED_OUTPATIENT_CLINIC_OR_DEPARTMENT_OTHER): Payer: Self-pay | Admitting: Emergency Medicine

## 2018-10-07 ENCOUNTER — Other Ambulatory Visit: Payer: Self-pay | Admitting: Urology

## 2018-10-07 NOTE — Progress Notes (Signed)
SPOKE W/  _ patient      SCREENING SYMPTOMS OF COVID 19:   COUGH--denies  RUNNY NOSE--- denies  SORE THROAT---denies  NASAL CONGESTION----denies  SNEEZING----denies  SHORTNESS OF BREATH---denies  DIFFICULTY BREATHING---denies  TEMP >100.0 -----denies  UNEXPLAINED BODY ACHES------denies  CHILLS -------- denies  HEADACHES ---------denies  LOSS OF SMELL/ TASTE --------denies    HAVE YOU OR ANY FAMILY MEMBER TRAVELLED PAST 14 DAYS OUT OF THE   COUNTY---denies  STATE----denies COUNTRY----denies  HAVE YOU OR ANY FAMILY MEMBER BEEN EXPOSED TO ANYONE WITH COVID 19?    denies

## 2018-10-07 NOTE — Progress Notes (Addendum)
SPOKE WITH: patient  RIDING HOME WITH: wife shadonna  AM MEDICATIONS: none NPO STATUS: npo after mn LABS:  COMMENTS/CONCERNS: instructed to complete covid 19 testing tomorrow between 11a-3p. Aware once tested for covid, he will need to quarantine until results received- doctors appt permitted  ICE,;RN, BSN.

## 2018-10-08 ENCOUNTER — Other Ambulatory Visit (HOSPITAL_COMMUNITY)
Admission: RE | Admit: 2018-10-08 | Discharge: 2018-10-08 | Disposition: A | Discharge status: Home / Self Care | Payer: 59 | Source: Ambulatory Visit | Attending: Urology | Admitting: Urology

## 2018-10-08 DIAGNOSIS — Z1159 Encounter for screening for other viral diseases: Secondary | ICD-10-CM | POA: Diagnosis not present

## 2018-10-10 LAB — NOVEL CORONAVIRUS, NAA (HOSP ORDER, SEND-OUT TO REF LAB; TAT 18-24 HRS): SARS-CoV-2, NAA: NOT DETECTED

## 2018-10-12 NOTE — Progress Notes (Signed)
SPOKE W/  _[name]      SCREENING SYMPTOMS OF COVID 19:   COUGH--no  RUNNY NOSE---no   SORE THROAT no  NASAL CONGESTION no  SNEEZING no  SHORTNESS OF BREATH no  DIFFICULTY BREATHING no  TEMP >100.0 no  UNEXPLAINED BODY ACHES no  CHILLS no   HEADACHES no  LOSS OF SMELL/ TASTE no    HAVE YOU OR ANY FAMILY MEMBER TRAVELLED PAST 14 DAYS OUT OF THE   COUNTY no STATE no COUNTRY no  HAVE YOU OR ANY FAMILY MEMBER BEEN EXPOSED TO ANYONE WITH COVID 19? no

## 2018-10-12 NOTE — Anesthesia Preprocedure Evaluation (Addendum)
Anesthesia Evaluation  Patient identified by MRN, date of birth, ID band Patient awake    Reviewed: Allergy & Precautions, NPO status , Patient's Chart, lab work & pertinent test results  Airway Mallampati: II  TM Distance: >3 FB Neck ROM: Full    Dental  (+) Teeth Intact, Dental Advisory Given, Chipped,    Pulmonary Current Smoker,    Pulmonary exam normal breath sounds clear to auscultation       Cardiovascular hypertension (no medications), Normal cardiovascular exam Rhythm:Regular Rate:Normal     Neuro/Psych  Headaches, PSYCHIATRIC DISORDERS Anxiety Depression Schizophrenia  Neuromuscular disease    GI/Hepatic Neg liver ROS, GERD (Prilosec PRN)  Medicated and Controlled,  Endo/Other  negative endocrine ROS  Renal/GU RIGHT HYDRONEPHROSIS GROSS HEMATURIA     Musculoskeletal  (+) Arthritis ,   Abdominal   Peds  Hematology negative hematology ROS (+)   Anesthesia Other Findings Day of surgery medications reviewed with the patient.  Reproductive/Obstetrics                            Anesthesia Physical Anesthesia Plan  ASA: II  Anesthesia Plan: General   Post-op Pain Management:    Induction: Intravenous  PONV Risk Score and Plan: 2 and Treatment may vary due to age or medical condition, Dexamethasone, Ondansetron, Midazolam and Diphenhydramine  Airway Management Planned: LMA  Additional Equipment:   Intra-op Plan:   Post-operative Plan: Extubation in OR  Informed Consent: I have reviewed the patients History and Physical, chart, labs and discussed the procedure including the risks, benefits and alternatives for the proposed anesthesia with the patient or authorized representative who has indicated his/her understanding and acceptance.     Dental advisory given  Plan Discussed with: CRNA  Anesthesia Plan Comments:        Anesthesia Quick Evaluation

## 2018-10-13 ENCOUNTER — Ambulatory Visit (HOSPITAL_BASED_OUTPATIENT_CLINIC_OR_DEPARTMENT_OTHER)
Admission: RE | Admit: 2018-10-13 | Discharge: 2018-10-13 | Disposition: A | Discharge status: Home / Self Care | Payer: 59 | Attending: Urology | Admitting: Urology

## 2018-10-13 ENCOUNTER — Encounter (HOSPITAL_BASED_OUTPATIENT_CLINIC_OR_DEPARTMENT_OTHER)
Admission: RE | Disposition: A | Discharge status: Home / Self Care | Payer: Self-pay | Source: Home / Self Care | Attending: Urology

## 2018-10-13 ENCOUNTER — Encounter (HOSPITAL_BASED_OUTPATIENT_CLINIC_OR_DEPARTMENT_OTHER): Payer: Self-pay

## 2018-10-13 ENCOUNTER — Ambulatory Visit (HOSPITAL_BASED_OUTPATIENT_CLINIC_OR_DEPARTMENT_OTHER): Payer: 59 | Admitting: Anesthesiology

## 2018-10-13 DIAGNOSIS — F17200 Nicotine dependence, unspecified, uncomplicated: Secondary | ICD-10-CM | POA: Insufficient documentation

## 2018-10-13 DIAGNOSIS — R310 Gross hematuria: Secondary | ICD-10-CM

## 2018-10-13 DIAGNOSIS — G709 Myoneural disorder, unspecified: Secondary | ICD-10-CM | POA: Diagnosis not present

## 2018-10-13 DIAGNOSIS — M1990 Unspecified osteoarthritis, unspecified site: Secondary | ICD-10-CM | POA: Diagnosis not present

## 2018-10-13 DIAGNOSIS — F209 Schizophrenia, unspecified: Secondary | ICD-10-CM | POA: Insufficient documentation

## 2018-10-13 DIAGNOSIS — F329 Major depressive disorder, single episode, unspecified: Secondary | ICD-10-CM | POA: Insufficient documentation

## 2018-10-13 DIAGNOSIS — N5201 Erectile dysfunction due to arterial insufficiency: Secondary | ICD-10-CM | POA: Insufficient documentation

## 2018-10-13 DIAGNOSIS — I10 Essential (primary) hypertension: Secondary | ICD-10-CM | POA: Diagnosis not present

## 2018-10-13 DIAGNOSIS — N3081 Other cystitis with hematuria: Secondary | ICD-10-CM | POA: Diagnosis not present

## 2018-10-13 DIAGNOSIS — F419 Anxiety disorder, unspecified: Secondary | ICD-10-CM | POA: Diagnosis not present

## 2018-10-13 DIAGNOSIS — N1330 Unspecified hydronephrosis: Secondary | ICD-10-CM | POA: Diagnosis not present

## 2018-10-13 DIAGNOSIS — N3031 Trigonitis with hematuria: Secondary | ICD-10-CM | POA: Diagnosis not present

## 2018-10-13 HISTORY — DX: Pulsatile tinnitus, right ear: H93.A1

## 2018-10-13 HISTORY — PX: CYSTOSCOPY WITH RETROGRADE PYELOGRAM, URETEROSCOPY AND STENT PLACEMENT: SHX5789

## 2018-10-13 SURGERY — CYSTOURETEROSCOPY, WITH RETROGRADE PYELOGRAM AND STENT INSERTION
Anesthesia: General | Site: Renal | Laterality: Bilateral

## 2018-10-13 MED ORDER — CEFAZOLIN SODIUM-DEXTROSE 2-4 GM/100ML-% IV SOLN
INTRAVENOUS | Status: AC
  Filled 2018-10-13: qty 100

## 2018-10-13 MED ORDER — BELLADONNA ALKALOIDS-OPIUM 16.2-60 MG RE SUPP
RECTAL | Status: DC | PRN
  Administered 2018-10-13: 1 via RECTAL

## 2018-10-13 MED ORDER — LIDOCAINE 2% (20 MG/ML) 5 ML SYRINGE
INTRAMUSCULAR | Status: DC | PRN
  Administered 2018-10-13: 100 mg via INTRAVENOUS

## 2018-10-13 MED ORDER — ONDANSETRON HCL 4 MG/2ML IJ SOLN
INTRAMUSCULAR | Status: DC | PRN
  Administered 2018-10-13: 4 mg via INTRAVENOUS

## 2018-10-13 MED ORDER — KETOROLAC TROMETHAMINE 30 MG/ML IJ SOLN
INTRAMUSCULAR | Status: AC
  Filled 2018-10-13: qty 1

## 2018-10-13 MED ORDER — IOHEXOL 300 MG/ML  SOLN
INTRAMUSCULAR | Status: DC | PRN
  Administered 2018-10-13: 20 mL

## 2018-10-13 MED ORDER — SODIUM CHLORIDE 0.9 % IR SOLN
Status: DC | PRN
  Administered 2018-10-13: 3000 mL

## 2018-10-13 MED ORDER — PROPOFOL 10 MG/ML IV BOLUS
INTRAVENOUS | Status: AC
  Filled 2018-10-13: qty 40

## 2018-10-13 MED ORDER — DEXAMETHASONE SODIUM PHOSPHATE 10 MG/ML IJ SOLN
INTRAMUSCULAR | Status: DC | PRN
  Administered 2018-10-13: 10 mg via INTRAVENOUS

## 2018-10-13 MED ORDER — LACTATED RINGERS IV SOLN
INTRAVENOUS | Status: DC
  Administered 2018-10-13 (×2): via INTRAVENOUS
  Filled 2018-10-13: qty 1000

## 2018-10-13 MED ORDER — ONDANSETRON HCL 4 MG/2ML IJ SOLN
INTRAMUSCULAR | Status: AC
  Filled 2018-10-13: qty 2

## 2018-10-13 MED ORDER — TRAMADOL HCL 50 MG PO TABS: 50.0000 mg | ORAL_TABLET | Freq: Four times a day (QID) | ORAL | 0 refills | Status: DC | PRN

## 2018-10-13 MED ORDER — ACETAMINOPHEN 500 MG PO TABS
1000.0000 mg | ORAL_TABLET | Freq: Once | ORAL | Status: AC
  Administered 2018-10-13: 1000 mg via ORAL
  Filled 2018-10-13: qty 2

## 2018-10-13 MED ORDER — FENTANYL CITRATE (PF) 100 MCG/2ML IJ SOLN
INTRAMUSCULAR | Status: AC
  Filled 2018-10-13: qty 2

## 2018-10-13 MED ORDER — DEXAMETHASONE SODIUM PHOSPHATE 10 MG/ML IJ SOLN
INTRAMUSCULAR | Status: AC
  Filled 2018-10-13: qty 1

## 2018-10-13 MED ORDER — KETOROLAC TROMETHAMINE 30 MG/ML IJ SOLN
INTRAMUSCULAR | Status: DC | PRN
  Administered 2018-10-13: 30 mg via INTRAVENOUS

## 2018-10-13 MED ORDER — LIDOCAINE HCL URETHRAL/MUCOSAL 2 % EX GEL
CUTANEOUS | Status: DC | PRN
  Administered 2018-10-13: 1 via URETHRAL

## 2018-10-13 MED ORDER — PHENAZOPYRIDINE HCL 200 MG PO TABS: 200.0000 mg | ORAL_TABLET | Freq: Three times a day (TID) | ORAL | 0 refills | Status: DC | PRN

## 2018-10-13 MED ORDER — ACETAMINOPHEN 500 MG PO TABS
ORAL_TABLET | ORAL | Status: AC
  Filled 2018-10-13: qty 2

## 2018-10-13 MED ORDER — FENTANYL CITRATE (PF) 100 MCG/2ML IJ SOLN
INTRAMUSCULAR | Status: DC | PRN
  Administered 2018-10-13: 50 ug via INTRAVENOUS
  Administered 2018-10-13 (×2): 25 ug via INTRAVENOUS
  Administered 2018-10-13: 50 ug via INTRAVENOUS

## 2018-10-13 MED ORDER — PROPOFOL 10 MG/ML IV BOLUS
INTRAVENOUS | Status: DC | PRN
  Administered 2018-10-13: 50 mg via INTRAVENOUS
  Administered 2018-10-13: 200 mg via INTRAVENOUS

## 2018-10-13 MED ORDER — CEFAZOLIN SODIUM-DEXTROSE 2-4 GM/100ML-% IV SOLN
2.0000 g | INTRAVENOUS | Status: AC
  Administered 2018-10-13: 2 g via INTRAVENOUS
  Filled 2018-10-13: qty 100

## 2018-10-13 MED ORDER — MIDAZOLAM HCL 2 MG/2ML IJ SOLN
INTRAMUSCULAR | Status: DC | PRN
  Administered 2018-10-13: 2 mg via INTRAVENOUS

## 2018-10-13 MED ORDER — LIDOCAINE 2% (20 MG/ML) 5 ML SYRINGE
INTRAMUSCULAR | Status: AC
  Filled 2018-10-13: qty 5

## 2018-10-13 MED ORDER — MIDAZOLAM HCL 2 MG/2ML IJ SOLN
INTRAMUSCULAR | Status: AC
  Filled 2018-10-13: qty 2

## 2018-10-13 SURGICAL SUPPLY — 28 items
BAG DRAIN URO-CYSTO SKYTR STRL (DRAIN) ×3 IMPLANT
BAG DRN UROCATH (DRAIN) ×1
BASKET LASER NITINOL 1.9FR (BASKET) IMPLANT
BSKT STON RTRVL 120 1.9FR (BASKET)
CATH URET 5FR 28IN OPEN ENDED (CATHETERS) ×3 IMPLANT
CATH URET DUAL LUMEN 6-10FR 50 (CATHETERS) IMPLANT
CLOTH BEACON ORANGE TIMEOUT ST (SAFETY) ×3 IMPLANT
ELECT REM PT RETURN 9FT ADLT (ELECTROSURGICAL) ×3
ELECTRODE REM PT RTRN 9FT ADLT (ELECTROSURGICAL) IMPLANT
EXTRACTOR STONE 1.7FRX115CM (UROLOGICAL SUPPLIES) IMPLANT
FIBER LASER TRAC TIP (UROLOGICAL SUPPLIES) IMPLANT
GLOVE BIO SURGEON STRL SZ7.5 (GLOVE) ×3 IMPLANT
GLOVE BIOGEL PI IND STRL 6.5 (GLOVE) IMPLANT
GLOVE BIOGEL PI INDICATOR 6.5 (GLOVE) ×2
GLOVE INDICATOR 6.5 STRL GRN (GLOVE) ×2 IMPLANT
GOWN STRL REUS W/ TWL LRG LVL3 (GOWN DISPOSABLE) IMPLANT
GOWN STRL REUS W/TWL LRG LVL3 (GOWN DISPOSABLE) ×3
GOWN STRL REUS W/TWL XL LVL3 (GOWN DISPOSABLE) ×3 IMPLANT
GUIDEWIRE ANG ZIPWIRE 038X150 (WIRE) IMPLANT
GUIDEWIRE STR DUAL SENSOR (WIRE) ×3 IMPLANT
IV NS IRRIG 3000ML ARTHROMATIC (IV SOLUTION) ×6 IMPLANT
KIT TURNOVER CYSTO (KITS) ×3 IMPLANT
MANIFOLD NEPTUNE II (INSTRUMENTS) ×2 IMPLANT
NS IRRIG 500ML POUR BTL (IV SOLUTION) ×3 IMPLANT
PACK CYSTO (CUSTOM PROCEDURE TRAY) ×3 IMPLANT
TUBE CONNECTING 12'X1/4 (SUCTIONS)
TUBE CONNECTING 12X1/4 (SUCTIONS) IMPLANT
TUBING UROLOGY SET (TUBING) ×3 IMPLANT

## 2018-10-13 NOTE — H&P (Signed)
Eval of flank pain  HPI: Derrick Ortiz is a 48 year-old male patient who was referred by Dr. Sherren Mocha McDiarmid, MD who is here for further eval and management of flank pain.  The problem is on the right side. His pain started about approximately 07/31/2017. The pain is dull. The pain is constant. The intensity of his pain is rated as a 6. The pain does radiate.   None makes the pain better. He has not been treated with any pain medications.   He has not had this same pain previously. He has not had kidney stones. He did see the blood in his urine. The patient has developed frequency. He has not had fever and chills. Imaging results: 1/20: normal (delayed excretion of right).     ALLERGIES: No Allergies    MEDICATIONS: Aleve TABS Oral     GU PSH: None     PSH Notes: Leg Repair, Hernia Repair   NON-GU PSH: Cholecystectomy (laparoscopic) Hernia Repair - 2014    GU PMH: Gross hematuria, Gross hematuria - 2014 Low back pain, Lumbago - 2014    NON-GU PMH: Anxiety, Anxiety (Symptom) - 2014 Personal history of other diseases of the circulatory system, History of hypertension - 2014 Personal history of other diseases of the digestive system, History of esophageal reflux - 2014 Personal history of other mental and behavioral disorders, History of depression - 2014 Encounter for general adult medical examination without abnormal findings, Encounter for preventive health examination GERD    FAMILY HISTORY: Breast Cancer - Runs In Family Diabetes - Runs in Family, Father Family Health Status Number - Runs In Family Hematuria - Father Hypertension - Runs in Family, Father, Mother   SOCIAL HISTORY: Marital Status: Single Preferred Language: English; Ethnicity: Not Hispanic Or Latino; Race: Black or African American Current Smoking Status: Patient smokes. Smokes 1/2 pack per day.   Tobacco Use Assessment Completed: Used Tobacco in last 30 days? Does not use smokeless tobacco. Does not  drink anymore.  Does not use drugs. Does not drink caffeine.     Notes: Tobacco use, Caffeine Use, Physical Disability Affecting Ability To Work, Alcohol Use, Marital History - Currently Married   REVIEW OF SYSTEMS:    GU Review Male:   Patient reports frequent urination, hard to postpone urination, leakage of urine, and erection problems. Patient denies burning/ pain with urination, get up at night to urinate, stream starts and stops, trouble starting your stream, have to strain to urinate , and penile pain.  Gastrointestinal (Upper):   Patient reports nausea. Patient denies vomiting and indigestion/ heartburn.  Gastrointestinal (Lower):   Patient denies diarrhea and constipation.  Constitutional:   Patient denies fever, night sweats, weight loss, and fatigue.  Skin:   Patient denies skin rash/ lesion and itching.  Eyes:   Patient denies double vision and blurred vision.  Ears/ Nose/ Throat:   Patient denies sore throat and sinus problems.  Hematologic/Lymphatic:   Patient denies swollen glands and easy bruising.  Cardiovascular:   Patient denies leg swelling and chest pains.  Respiratory:   Patient denies cough and shortness of breath.  Endocrine:   Patient denies excessive thirst.  Musculoskeletal:   Patient reports back pain. Patient denies joint pain.  Neurological:   Patient reports headaches. Patient denies dizziness.  Psychologic:   Patient denies depression and anxiety.   Notes: Right intermittent flank pain and low back pain with some hematuria.     VITAL SIGNS:      08/09/2018 01:46 PM  Weight 215 lb / 97.52 kg  Height 72 in / 182.88 cm  BP 114/78 mmHg  Pulse 102 /min  Temperature 99.0 F / 37.2 C  BMI 29.2 kg/m   MULTI-SYSTEM PHYSICAL EXAMINATION:    Constitutional: Well-nourished. No physical deformities. Normally developed. Good grooming.  Neck: Neck symmetrical, not swollen. Normal tracheal position.  Respiratory: Normal breath sounds. No labored breathing, no use  of accessory muscles.   Cardiovascular: Regular rate and rhythm. No murmur, no gallop. Normal temperature, normal extremity pulses, no swelling, no varicosities.   Lymphatic: No enlargement of neck, axillae, groin.  Skin: No paleness, no jaundice, no cyanosis. No lesion, no ulcer, no rash.  Neurologic / Psychiatric: Oriented to time, oriented to place, oriented to person. No depression, no anxiety, no agitation.  Gastrointestinal: No mass, no tenderness, no rigidity, non obese abdomen.  Eyes: Normal conjunctivae. Normal eyelids.  Ears, Nose, Mouth, and Throat: Left ear no scars, no lesions, no masses. Right ear no scars, no lesions, no masses. Nose no scars, no lesions, no masses. Normal hearing. Normal lips.  Musculoskeletal: Normal gait and station of head and neck.     PAST DATA REVIEWED:  Source Of History:  Patient  Records Review:   Previous Doctor Records, Previous Patient Records, POC Tool  X-Ray Review: C.T. Abdomen/Pelvis: Reviewed Films. Discussed With Patient.     PROCEDURES:          Urinalysis w/Scope Dipstick Dipstick Cont'd Micro  Color: Yellow Bilirubin: Neg mg/dL WBC/hpf: NS (Not Seen)  Appearance: Clear Ketones: Neg mg/dL RBC/hpf: 3 - 10/hpf  Specific Gravity: 1.025 Blood: 2+ ery/uL Bacteria: NS (Not Seen)  pH: 6.0 Protein: Trace mg/dL Cystals: NS (Not Seen)  Glucose: Neg mg/dL Urobilinogen: 1.0 mg/dL Casts: NS (Not Seen)    Nitrites: Neg Trichomonas: Not Present    Leukocyte Esterase: Neg leu/uL Mucous: Present      Epithelial Cells: 0 - 5/hpf      Yeast: NS (Not Seen)      Sperm: Not Present    ASSESSMENT:      ICD-10 Details  1 GU:   Flank Pain - R10.84   2   Microscopic hematuria - R31.1   3   ED due to arterial insufficiency - N52.01    PLAN:            Medications New Meds: Sildenafil Citrate 100 mg tablet 1 tablet PO Daily PRN   #50  1 Refill(s)            Document Letter(s):  Created for Patient: Clinical Summary         Notes:   The  patient has had gross hematuria over the past several weeks. Today he has microscopic hematuria. He is complaining of right-sided flank pain. The CT scan while demonstrating no hydronephrosis did demonstrate delayed excretion of the right kidney. Our plan is to take the patient to the operating room for more thorough evaluation of the lower urinary tract including possible bladder biopsies and/or right diagnostic ureteroscopy.   The patient has persistent erectile dysfunction. I have given him a refill of sildenafil.

## 2018-10-13 NOTE — Anesthesia Postprocedure Evaluation (Signed)
Anesthesia Post Note  Patient: ORA MCNATT  Procedure(s) Performed: CYSTOSCOPY WITH BILATERAL RETROGRADE PYELOGRAM, RIGHT URETEROSCOPY , BLADDER BIOPSY (Bilateral Renal)     Patient location during evaluation: PACU Anesthesia Type: General Level of consciousness: awake and alert Pain management: pain level controlled Vital Signs Assessment: post-procedure vital signs reviewed and stable Respiratory status: spontaneous breathing, nonlabored ventilation, respiratory function stable and patient connected to nasal cannula oxygen Cardiovascular status: blood pressure returned to baseline and stable Postop Assessment: no apparent nausea or vomiting Anesthetic complications: no    Last Vitals:  Vitals:   10/13/18 1017 10/13/18 1047  BP: 127/85 (!) 159/90  Pulse: 74 62  Resp: 16 20  Temp: (!) 36.4 C 36.6 C  SpO2: 100% 99%    Last Pain:  Vitals:   10/13/18 1047  TempSrc: Oral  PainSc:                  Catalina Gravel

## 2018-10-13 NOTE — Interval H&P Note (Signed)
History and Physical Interval Note:  10/13/2018 8:29 AM  Derrick Ortiz  has presented today for surgery, with the diagnosis of RIGHT HYDRONEPHROSIS GROSS HEMATURIA.  The various methods of treatment have been discussed with the patient and family. After consideration of risks, benefits and other options for treatment, the patient has consented to  Procedure(s): BILATERAL CYSTOSCOPY WITH RETROGRADE PYELOGRAM, RIGHT URETEROSCOPY AND POSSIBLE RIGHT STENT PLACEMENT (Bilateral) as a surgical intervention.  The patient's history has been reviewed, patient examined, no change in status, stable for surgery.  I have reviewed the patient's chart and labs.  Questions were answered to the patient's satisfaction.     Ardis Hughs

## 2018-10-13 NOTE — Transfer of Care (Signed)
Immediate Anesthesia Transfer of Care Note  Patient: ISSAC MOURE  Procedure(s) Performed: Procedure(s) (LRB): CYSTOSCOPY WITH BILATERAL RETROGRADE PYELOGRAM, RIGHT URETEROSCOPY , BLADDER BIOPSY (Bilateral)  Patient Location: PACU  Anesthesia Type: General  Level of Consciousness: awake, oriented, sedated and patient cooperative  Airway & Oxygen Therapy: Patient Spontanous Breathing and Patient connected to face mask oxygen  Post-op Assessment: Report given to PACU RN and Post -op Vital signs reviewed and stable  Post vital signs: Reviewed and stable  Complications: No apparent anesthesia complications  Last Vitals:  Vitals Value Taken Time  BP    Temp    Pulse 97 10/13/2018  9:41 AM  Resp    SpO2 94 % 10/13/2018  9:41 AM  Vitals shown include unvalidated device data.  Last Pain:  Vitals:   10/13/18 0623  TempSrc: Oral  PainSc: 0-No pain      Patients Stated Pain Goal: 7 (10/13/18 5488)

## 2018-10-13 NOTE — Anesthesia Procedure Notes (Signed)
Procedure Name: LMA Insertion Date/Time: 10/13/2018 8:40 AM Performed by: Suan Halter, CRNA Pre-anesthesia Checklist: Patient identified, Emergency Drugs available, Suction available and Patient being monitored Patient Re-evaluated:Patient Re-evaluated prior to induction Oxygen Delivery Method: Circle system utilized Preoxygenation: Pre-oxygenation with 100% oxygen Induction Type: IV induction Ventilation: Mask ventilation without difficulty LMA: LMA inserted LMA Size: 4.0 Number of attempts: 1 Airway Equipment and Method: Bite block Placement Confirmation: positive ETCO2 Tube secured with: Tape Dental Injury: Teeth and Oropharynx as per pre-operative assessment

## 2018-10-13 NOTE — Op Note (Signed)
Preoperative diagnosis:  1. Right hydro-ureteronephrosis 2. Gross hematuria  Postoperative diagnosis:  1. Same  Procedure: 1. Cystoscopy, bilateral retrograde pyelograms with interpretation 2. Right diagnostic ureteroscopy 3. Bladder biopsy with fulguration  Surgeon: Ardis Hughs, MD  Anesthesia: General  Complications: None  Intraoperative findings:  #1: The patient's left retrograde pyelogram was performed using 15 cc of Omnipaque contrast.  This demonstrated no filling defects or hydroureteronephrosis.  The calyces were sharp and there was no significant abnormalities. #2: The patient's right retrograde pyelogram was performed using 15 cc of Omnipaque contrast.  This demonstrated some medial deviation of the distal ureter, but no significant narrowing or filling defects.  The caliber of the ureter was normal.  The calyces were sharp and there were no filling defects within the collecting system. #3: There was some erythema and mild mucosal changes or abnormalities at the trigone.  I biopsied these and fulgurated them.  EBL: Minimal  Specimens: Bladder trigone biopsy  Indication: Derrick Ortiz is a 48 y.o. patient with gross hematuria and right flank pain found to have right hydronephrosis on CT scan.  After reviewing the management options for treatment, he elected to proceed with the above surgical procedure(s). We have discussed the potential benefits and risks of the procedure, side effects of the proposed treatment, the likelihood of the patient achieving the goals of the procedure, and any potential problems that might occur during the procedure or recuperation. Informed consent has been obtained.  Description of procedure:  The patient was taken to the operating room and general anesthesia was induced.  The patient was placed in the dorsal lithotomy position, prepped and draped in the usual sterile fashion, and preoperative antibiotics were administered. A  preoperative time-out was performed.   21 French 30 degrees cystoscope was gently passed through the patient's urethra and into the bladder under visual guidance.  Cystoscopy was performed in a 360 degree fashion demonstrating some mild mucosal changes with some erythema at the trigone.  Otherwise the cystoscopy was normal.  The ureters were orthotopic.  I performed a left retrograde pyelogram through a 5 French rigid catheter with the above findings.  I then repeated the retrograde pyelogram on the patient's right side with the above findings.  I then passed a wire through the 5 [place] open-ended catheter and up into the right collecting system removing the catheter over the wire.  I then drained the bladder and remove the scope leaving the wire behind.  I then used a single lumen semirigid ureteroscope and was able to easily cannulate the patient's right ureteral orifice.  I navigated up to the patient's right renal pelvis atraumatically with no findings or significant abnormalities.  I then slowly backed out the ureteroscope again confirming that there were no significant abnormalities.  I then exchanged the the ureteroscope again for the 21 French cystoscope and using the rigid bladder biopsy forceps performed 2 small biopsies at the trigone in the area of the aforementioned abnormality.  The area was fulgurated with a Bugbee.  The bladder was subsequently emptied.  A B&O suppository was placed in the patient's rectum.  Exam and anesthesia demonstrated a normal size prostate with no significant abnormality or nodularity.  The bladder was freely mobile.  Lidocaine jelly was instilled into the patient's urethra.  He was subsequently extubated and returned to the PACU in stable condition.  Ardis Hughs, M.D.

## 2018-10-13 NOTE — Discharge Instructions (Signed)
°  Post Anesthesia Home Care Instructions  Activity: Get plenty of rest for the remainder of the day. A responsible adult should stay with you for 24 hours following the procedure.  For the next 24 hours, DO NOT: -Drive a car -Paediatric nurse -Drink alcoholic beverages -Take any medication unless instructed by your physician -Make any legal decisions or sign important papers.  Meals: Start with liquid foods such as gelatin or soup. Progress to regular foods as tolerated. Avoid greasy, spicy, heavy foods. If nausea and/or vomiting occur, drink only clear liquids until the nausea and/or vomiting subsides. Call your physician if vomiting continues.  Special Instructions/Symptoms: Your throat may feel dry or sore from the anesthesia or the breathing tube placed in your throat during surgery. If this causes discomfort, gargle with warm salt water. The discomfort should disappear within 24 hours.  If you had a scopolamine patch placed behind your ear for the management of post- operative nausea and/or vomiting:  1. The medication in the patch is effective for 72 hours, after which it should be removed.  Wrap patch in a tissue and discard in the trash. Wash hands thoroughly with soap and water. 2. You may remove the patch earlier than 72 hours if you experience unpleasant side effects which may include dry mouth, dizziness or visual disturbances. 3. Avoid touching the patch. Wash your hands with soap and water after contact with the patch.   DISCHARGE INSTRUCTIONS FOR KIDNEY STONE/URETERAL STENT   MEDICATIONS:  1.  Resume all your other meds from home - except do not take any extra narcotic pain meds that you may have at home.  2. Pyridium is to help with the burning/stinging when you urinate. 3. Tramadol is for moderate/severe pain, otherwise taking upto 1000 mg every 6 hours of plainTylenol will help treat your pain.    ACTIVITY:  1. No strenuous activity x 1week  2. No driving while on  narcotic pain medications  3. Drink plenty of water  4. Continue to walk at home - you can still get blood clots when you are at home, so keep active, but don't over do it.  5. May return to work/school tomorrow or when you feel ready   BATHING:  1. You can shower and we recommend daily showers   SIGNS/SYMPTOMS TO CALL:  Please call us if you have a fever greater than 101.5, uncontrolled nausea/vomiting, uncontrolled pain, dizziness, unable to urinate, bloody urine, chest pain, shortness of breath, leg swelling, leg pain, redness around wound, drainage from wound, or any other concerns or questions.   You can reach [place] at [phone].   FOLLOW-UP:  1. You have an appointment in 2 weeks with Dr. Louis Meckel.

## 2018-10-14 ENCOUNTER — Encounter (HOSPITAL_BASED_OUTPATIENT_CLINIC_OR_DEPARTMENT_OTHER): Payer: Self-pay | Admitting: Urology

## 2019-01-31 ENCOUNTER — Encounter: Payer: Self-pay | Admitting: Family Medicine

## 2019-02-27 ENCOUNTER — Ambulatory Visit (HOSPITAL_COMMUNITY)
Admission: EM | Admit: 2019-02-27 | Discharge: 2019-02-27 | Disposition: A | Discharge status: Home / Self Care | Payer: Medicaid Other | Attending: Emergency Medicine | Admitting: Emergency Medicine

## 2019-02-27 ENCOUNTER — Other Ambulatory Visit: Payer: Self-pay

## 2019-02-27 ENCOUNTER — Encounter (HOSPITAL_COMMUNITY): Payer: Self-pay | Admitting: *Deleted

## 2019-02-27 DIAGNOSIS — G43909 Migraine, unspecified, not intractable, without status migrainosus: Secondary | ICD-10-CM

## 2019-02-27 DIAGNOSIS — T59811A Toxic effect of smoke, accidental (unintentional), initial encounter: Secondary | ICD-10-CM

## 2019-02-27 DIAGNOSIS — J705 Respiratory conditions due to smoke inhalation: Secondary | ICD-10-CM

## 2019-02-27 MED ORDER — SUMATRIPTAN SUCCINATE 100 MG PO TABS: 100.0000 mg | ORAL_TABLET | ORAL | 0 refills | Status: DC | PRN

## 2019-02-27 MED ORDER — IBUPROFEN 600 MG PO TABS: 600.0000 mg | ORAL_TABLET | Freq: Four times a day (QID) | ORAL | 0 refills | Status: DC | PRN
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
## 2019-04-24 ENCOUNTER — Ambulatory Visit (HOSPITAL_COMMUNITY)
Admission: EM | Admit: 2019-04-24 | Discharge: 2019-04-24 | Disposition: A | Discharge status: Home / Self Care | Payer: Medicaid Other | Attending: Family Medicine | Admitting: Family Medicine

## 2019-04-24 ENCOUNTER — Other Ambulatory Visit: Payer: Self-pay

## 2019-04-24 ENCOUNTER — Encounter (HOSPITAL_COMMUNITY): Payer: Self-pay

## 2019-04-24 DIAGNOSIS — R21 Rash and other nonspecific skin eruption: Secondary | ICD-10-CM

## 2019-04-24 DIAGNOSIS — L989 Disorder of the skin and subcutaneous tissue, unspecified: Secondary | ICD-10-CM | POA: Diagnosis not present

## 2019-04-24 DIAGNOSIS — R238 Other skin changes: Secondary | ICD-10-CM

## 2019-04-24 MED ORDER — SULFAMETHOXAZOLE-TRIMETHOPRIM 800-160 MG PO TABS: 1.0000 | ORAL_TABLET | Freq: Two times a day (BID) | ORAL | 0 refills | Status: AC

## 2019-04-24 MED ORDER — TRAMADOL HCL 50 MG PO TABS: 50.0000 mg | ORAL_TABLET | Freq: Four times a day (QID) | ORAL | 0 refills | Status: DC | PRN

## 2019-04-24 MED ORDER — VALACYCLOVIR HCL 1 G PO TABS: 1000.0000 mg | ORAL_TABLET | Freq: Three times a day (TID) | ORAL | 0 refills | Status: AC

## 2019-04-24 NOTE — ED Triage Notes (Signed)
Patient presents to Urgent Care with complaints of rash under his right axilla since a week ago. Patient reports the rash appears to be multiple small abscesses, some are open and draining, others are not. Pt states it is itchy but also burning painful.
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
## 2019-04-24 NOTE — Discharge Instructions (Addendum)
Recommend start Valtrex (antiviral) 1000mg  3 times a day for 7 days. Take Bactrim (antibiotic) twice a day for 10 days- take with food. May use Tramadol 50mg  every 6 hours as needed for pain. Continue Ibuprofen 600mg  every 6 to 8 hours as needed for pain. Apply cool compresses to area for comfort. Follow-up in 2 to 3 days if not improving.

## 2019-06-14 ENCOUNTER — Ambulatory Visit: Payer: Medicaid Other

## 2019-06-15 ENCOUNTER — Other Ambulatory Visit: Payer: Self-pay

## 2019-06-15 ENCOUNTER — Ambulatory Visit (INDEPENDENT_AMBULATORY_CARE_PROVIDER_SITE_OTHER): Payer: Medicaid Other | Admitting: Family Medicine

## 2019-06-15 ENCOUNTER — Encounter: Payer: Self-pay | Admitting: Family Medicine

## 2019-06-15 DIAGNOSIS — F329 Major depressive disorder, single episode, unspecified: Secondary | ICD-10-CM

## 2019-06-15 DIAGNOSIS — F419 Anxiety disorder, unspecified: Secondary | ICD-10-CM | POA: Diagnosis present

## 2019-06-15 DIAGNOSIS — M79629 Pain in unspecified upper arm: Secondary | ICD-10-CM

## 2019-06-15 DIAGNOSIS — F32A Depression, unspecified: Secondary | ICD-10-CM

## 2019-06-15 DIAGNOSIS — M79621 Pain in right upper arm: Secondary | ICD-10-CM | POA: Diagnosis not present

## 2019-06-15 HISTORY — DX: Pain in unspecified upper arm: M79.629

## 2019-06-15 MED ORDER — DOXYCYCLINE HYCLATE 100 MG PO TABS: 100.0000 mg | ORAL_TABLET | Freq: Two times a day (BID) | ORAL | 0 refills | Status: AC

## 2019-06-15 MED ORDER — HYDROXYZINE PAMOATE 50 MG PO CAPS: 50.0000 mg | ORAL_CAPSULE | Freq: Every day | ORAL | 0 refills | Status: DC

## 2019-06-15 NOTE — Progress Notes (Signed)
Subjective  Derrick Ortiz is a 49 y.o. male is presenting with the following  AXILLA PAIN Has focal small painful nodules in R axilla.  Had similar in November but not as bad.  Seen in ER and treated with antibiotics and for shingles. He is not having discharge or fever or nausea and vomiting  INSOMNIA Having trouble getting to and staying asleep.  Has a history of hearing voices and depression but does not feel down nor hearing any voices.  Does feel tired.  No SI or HI.   In past took seroquel and trazadone according to Epic.  He recalls these made him feel bad and he does not want to take them.  Does not want to see a counselor  Objective Vital Signs reviewed BP 120/80   Pulse 90   Wt 217 lb 9.6 oz (98.7 kg)   SpO2 99%   BMI 30.35 kg/m  Psych:  Cognition and judgment appear intact. Alert, communicative  and cooperative with normal attention span and concentration. No apparent delusions, illusions, hallucinations R Axilla - two < 1cm firm tender non fluctuant nodules.   L Axilla normal  Assessments/Plans  Anxiety and depression He denies anxiety or depression and feels he just can't sleep.  We discussed sleep hygiene.  Short trial of hydroxyzine.  Recommend he follow up with PCP and call us if feeling worse or wishes counseling   Axillary pain Appears to have nonsupparative folliculitis. No evidence of shingles.  Will treat with doxycycline and follow up.  May need further investigation as to why recurring if it does not resolve    See after visit summary for details of patient instructions

## 2019-06-15 NOTE — Patient Instructions (Addendum)
Good to see you today!  Thanks for coming in.  For the underarm infection\  Take docycycline 100 mg every 12 hours for the next 10 day  Use a warm washcloth 4x a day for 20 minutes  If getting worse or have fever come back right away  For the sleep  Go to bed same time 10 and get up 6 every day  Do not take naps  Take the hydroxyziine 50 mg about 30 minutes before  If you are feeling worse more depressed or any thoughts of harming then call us   Make an appointment to see Dr McDiarmid in one month

## 2019-06-15 NOTE — Assessment & Plan Note (Signed)
Appears to have nonsupparative folliculitis. No evidence of shingles.  Will treat with doxycycline and follow up.  May need further investigation as to why recurring if it does not resolve

## 2019-06-15 NOTE — Assessment & Plan Note (Signed)
He denies anxiety or depression and feels he just can't sleep.  We discussed sleep hygiene.  Short trial of hydroxyzine.  Recommend he follow up with PCP and call us if feeling worse or wishes counseling

## 2019-07-28 ENCOUNTER — Ambulatory Visit: Payer: Medicaid Other | Admitting: Family Medicine

## 2019-11-22 ENCOUNTER — Ambulatory Visit: Payer: Medicaid Other | Admitting: Family Medicine

## 2019-11-29 ENCOUNTER — Ambulatory Visit: Payer: Medicaid Other | Admitting: Family Medicine

## 2019-12-08 ENCOUNTER — Other Ambulatory Visit (HOSPITAL_COMMUNITY)
Admission: RE | Admit: 2019-12-08 | Discharge: 2019-12-08 | Disposition: A | Discharge status: Home / Self Care | Payer: Medicaid Other | Source: Ambulatory Visit | Attending: Family Medicine | Admitting: Family Medicine

## 2019-12-08 ENCOUNTER — Ambulatory Visit (INDEPENDENT_AMBULATORY_CARE_PROVIDER_SITE_OTHER): Payer: Medicaid Other | Admitting: Family Medicine

## 2019-12-08 ENCOUNTER — Encounter: Payer: Self-pay | Admitting: Family Medicine

## 2019-12-08 ENCOUNTER — Other Ambulatory Visit: Payer: Self-pay

## 2019-12-08 VITALS — BP 132/89 | HR 90 | Ht 71.0 in | Wt 217.2 lb

## 2019-12-08 DIAGNOSIS — Z13220 Encounter for screening for lipoid disorders: Secondary | ICD-10-CM

## 2019-12-08 DIAGNOSIS — N451 Epididymitis: Secondary | ICD-10-CM | POA: Insufficient documentation

## 2019-12-08 DIAGNOSIS — K644 Residual hemorrhoidal skin tags: Secondary | ICD-10-CM

## 2019-12-08 DIAGNOSIS — R310 Gross hematuria: Secondary | ICD-10-CM

## 2019-12-08 DIAGNOSIS — Z114 Encounter for screening for human immunodeficiency virus [HIV]: Secondary | ICD-10-CM

## 2019-12-08 DIAGNOSIS — Z9189 Other specified personal risk factors, not elsewhere classified: Secondary | ICD-10-CM | POA: Diagnosis not present

## 2019-12-08 DIAGNOSIS — Z1159 Encounter for screening for other viral diseases: Secondary | ICD-10-CM | POA: Diagnosis not present

## 2019-12-08 DIAGNOSIS — E7800 Pure hypercholesterolemia, unspecified: Secondary | ICD-10-CM

## 2019-12-08 DIAGNOSIS — Z125 Encounter for screening for malignant neoplasm of prostate: Secondary | ICD-10-CM

## 2019-12-08 HISTORY — DX: Epididymitis: N45.1

## 2019-12-08 NOTE — Patient Instructions (Signed)
I believe you have an inflamed Epididymis - a small part of your testicle.   Once we know whether or not it is from Chlamydia or not, Dr Anjelina Dung will start you on antibiotics.   If your hemorrhoid comes out and won't go back in, let Dr Cortez Flippen know.  If you want to see a surgeon about treating the hemorrhoid, let Dr McDairmid know.   Dr Milanie Rosenfield will call you with the test results.

## 2019-12-09 ENCOUNTER — Encounter: Payer: Self-pay | Admitting: Family Medicine

## 2019-12-09 DIAGNOSIS — N451 Epididymitis: Secondary | ICD-10-CM

## 2019-12-09 LAB — URINE CYTOLOGY ANCILLARY ONLY
Chlamydia: NEGATIVE
Comment: NEGATIVE
Comment: NORMAL
Neisseria Gonorrhea: NEGATIVE

## 2019-12-09 LAB — LIPID PANEL
Chol/HDL Ratio: 5.3 ratio — ABNORMAL HIGH (ref 0.0–5.0)
Cholesterol, Total: 202 mg/dL — ABNORMAL HIGH (ref 100–199)
HDL: 38 mg/dL — ABNORMAL LOW (ref >39)
LDL Chol Calc (NIH): 149 mg/dL — ABNORMAL HIGH (ref 0–99)
Triglycerides: 83 mg/dL (ref 0–149)
VLDL Cholesterol Cal: 15 mg/dL (ref 5–40)

## 2019-12-09 LAB — HEPATITIS C ANTIBODY: Hep C Virus Ab: <0.1 s/co ratio (ref 0.0–0.9)

## 2019-12-09 LAB — PSA: Prostate Specific Ag, Serum: 0.8 ng/mL (ref 0.0–4.0)

## 2019-12-09 LAB — HIV ANTIBODY (ROUTINE TESTING W REFLEX): HIV Screen 4th Generation wRfx: NONREACTIVE

## 2019-12-09 MED ORDER — SULFAMETHOXAZOLE-TRIMETHOPRIM 800-160 MG PO TABS: 1.0000 | ORAL_TABLET | Freq: Two times a day (BID) | ORAL | 0 refills | Status: AC

## 2019-12-09 NOTE — Progress Notes (Signed)
Acute Epididymitis, right

## 2019-12-12 ENCOUNTER — Telehealth: Payer: Self-pay | Admitting: Family Medicine

## 2019-12-12 ENCOUNTER — Encounter: Payer: Self-pay | Admitting: Family Medicine

## 2019-12-12 MED ORDER — ATORVASTATIN CALCIUM 20 MG PO TABS: 20.0000 mg | ORAL_TABLET | Freq: Every day | ORAL | 3 refills | Status: DC

## 2019-12-12 NOTE — Progress Notes (Signed)
Derrick Ortiz is alone Sources of clinical information for visit is/are patient and past medical records. Nursing assessment for this office visit was reviewed with the patient for accuracy and revision.     Previous Report(s) Reviewed: office notes  Depression screen [facility] 2/9 12/08/2019  Decreased Interest 0  Down, Depressed, Hopeless 0  PHQ - 2 Score 0  Altered sleeping -  Tired, decreased energy -  Change in appetite -  Feeling bad or failure about yourself  -  Trouble concentrating -  Moving slowly or fidgety/restless -  Suicidal thoughts -  PHQ-9 Score -    Fall Risk  12/08/2019 06/15/2019 04/28/2018  Falls in the past year? 0 0 0  Number falls in past yr: 0 0 -  Follow up Falls evaluation completed - -    PHQ9 SCORE ONLY 12/08/2019 06/15/2019 04/28/2018  PHQ-9 Total Score 0 5 0    Adult vaccines due  Topic Date Due   TETANUS/TDAP  12/26/2018    Health Maintenance Due  Topic Date Due   COVID-19 Vaccine (1) Never done   TETANUS/TDAP  12/26/2018      History/P.E. limitations: none  Adult vaccines due  Topic Date Due   TETANUS/TDAP  12/26/2018   There are no preventive care reminders to display for this patient.  Health Maintenance Due  Topic Date Due   COVID-19 Vaccine (1) Never done   TETANUS/TDAP  12/26/2018     Chief Complaint  Patient presents with   Annual Exam    Visit Problem List with A/P  Pure hypercholesterolemia Established problem, ASCVD risk 8.9%, intermediate risk CV event 10 yr Uncontrolled Lab Results  Component Value Date   CHOL 202 (H) 12/08/2019   HDL 38 (L) 12/08/2019   LDLCALC 149 (H) 12/08/2019   TRIG 83 12/08/2019   CHOLHDL 5.3 (H) 12/08/2019    Start: Atorvastatin, moderate potency. 20 mg daily RTC 3 month to check LDL-D    Epididymitis New problem Acute, right epididymis tender to palpation Urine GC/Chlamydia negative.  Working diagnosis of acute epididymitis, right,  Rx Septra DS, one tab BID x 10 days.  Analgesics with NSAIDs, ice, jock strap to elevate RTC if not improved or worsens

## 2019-12-12 NOTE — Addendum Note (Signed)
Addended byWendy Poet, Brittnye Josephs D on: 12/12/2019 12:18 PM   Modules accepted: Orders

## 2019-12-12 NOTE — Assessment & Plan Note (Addendum)
New problem Acute, right epididymis tender to palpation Urine GC/Chlamydia negative.  Working diagnosis of acute epididymitis, right,  Rx Septra DS, one tab BID x 10 days. Analgesics with NSAIDs, ice, jock strap to elevate RTC if not improved or worsens

## 2019-12-12 NOTE — Telephone Encounter (Signed)
I spoke with Derrick Ortiz about his lipid reults.  We discussed his intermediate risk, 9%, of CV E in 10 yrs. Given his

## 2019-12-12 NOTE — Assessment & Plan Note (Signed)
Established problem, ASCVD risk 8.9%, intermediate risk CV event 10 yr Uncontrolled Lab Results  Component Value Date   CHOL 202 (H) 12/08/2019   HDL 38 (L) 12/08/2019   LDLCALC 149 (H) 12/08/2019   TRIG 83 12/08/2019   CHOLHDL 5.3 (H) 12/08/2019    Start: Atorvastatin, moderate potency. 20 mg daily RTC 3 month to check LDL-D

## 2019-12-13 NOTE — Telephone Encounter (Signed)
Given Derrick Ortiz's comorbid tobacco use, we agreed that he should start a moderate dose statin, Atorvastatin 20 mg daily with recheck of LDL-D in 3 months.

## 2020-01-16 ENCOUNTER — Ambulatory Visit: Payer: Medicaid Other | Admitting: Family Medicine

## 2020-01-17 ENCOUNTER — Ambulatory Visit: Payer: Medicaid Other

## 2020-01-17 ENCOUNTER — Encounter: Payer: Self-pay | Admitting: Family Medicine

## 2020-01-18 ENCOUNTER — Other Ambulatory Visit: Payer: Self-pay

## 2020-01-18 ENCOUNTER — Ambulatory Visit (INDEPENDENT_AMBULATORY_CARE_PROVIDER_SITE_OTHER): Payer: Medicaid Other | Admitting: Family Medicine

## 2020-01-18 VITALS — BP 132/92 | HR 87 | Wt 217.6 lb

## 2020-01-18 DIAGNOSIS — M5430 Sciatica, unspecified side: Secondary | ICD-10-CM | POA: Diagnosis present

## 2020-01-18 DIAGNOSIS — G5701 Lesion of sciatic nerve, right lower limb: Secondary | ICD-10-CM

## 2020-01-18 DIAGNOSIS — G5700 Lesion of sciatic nerve, unspecified lower limb: Secondary | ICD-10-CM

## 2020-01-18 HISTORY — DX: Lesion of sciatic nerve, unspecified lower limb: G57.00

## 2020-01-18 MED ORDER — IBUPROFEN 200 MG PO TABS: 400.0000 mg | ORAL_TABLET | Freq: Four times a day (QID) | ORAL | 0 refills | Status: AC | PRN

## 2020-01-18 NOTE — Progress Notes (Signed)
    SUBJECTIVE:   CHIEF COMPLAINT / HPI:   Right leg pain: Patient is a 49 year old male that presents today to discuss leg pain associated with some shooting pains down right leg to his knee. He was playing football with some friends about 3 weeks ago when he sprinted for a ball and believes he pulled his back.  He noted the next morning that he had some pain shooting down his leg as well as significant back pain.  His back pain has since resolved but he continues to have pain shooting down his right leg.  He states this pain is worse when moving around but sometimes sit in certain positions does aggravate it.  His pain goes down to his right knee and is of a shooting consistency.  He has tried ibuprofen and topical rubs which have not provided any benefit.  He also notes an area of tenderness in the side of his right "buttock".  Timeframe/onset: 3 weeks ago. Severity (1-10): 9/10 Location: right leg sciatic pain Character of pain: shooting Pain improves with: sitting down in certain positions, has tried some "pain rubs" which do not improve Pain worse with: movement  PERTINENT  PMH / PSH: None relevant  OBJECTIVE:   BP (!) 132/92   Pulse 87   Wt 217 lb 9.6 oz (98.7 kg)   SpO2 97%   BMI 30.35 kg/m    General: NAD, pleasant, able to participate in exam Respiratory: No respiratory distress. MSK: No pain on palpation or hypertonicity noted of the musculature of the lower back.  Patient with significant right-sided pain with straight leg raise that does radiate down to his knee.  Patient also with tenderness to the area of the right piriformis on palpation.  Patient with no overlying bruising or erythema of the region.  Patient without pain when stretching the hamstring musculature and no pain to palpation of the body of the hamstring muscles.   ASSESSMENT/PLAN:   Piriformis syndrome Assessment: Patient with a previous back and glute injury after spraining for a football about 3 weeks  ago with now resolved back pain but continued tenderness to the area of the piriformis on the right side.  Patient also endorses pain shooting down to his knee which is present with straight leg raise.  Patient with noted hypertonicity of the piriformis and tenderness to the musculature on palpation.  Patient with no noted pain to the muscle body of the hamstring and no pain with stretching of the hamstring musculature.  No saddle paresthesias, loss of bowel or bladder function.  Patient's back pain has completely resolved at this point. Plan: -We will provide referral for physical therapy as patient would likely benefit from stretching and some strengthening of the piriformis musculature as well as the surrounding musculature -We will provide patient prescription for ibuprofen 400 mg every 6 hours as needed for pain for the next 5 days as he does not have noted CKD or other contraindications. -Return precautions provided to patient      Lurline Del, [place]

## 2020-01-18 NOTE — Assessment & Plan Note (Signed)
Assessment: Patient with a previous back and glute injury after spraining for a football about 3 weeks ago with now resolved back pain but continued tenderness to the area of the piriformis on the right side.  Patient also endorses pain shooting down to his knee which is present with straight leg raise.  Patient with noted hypertonicity of the piriformis and tenderness to the musculature on palpation.  Patient with no noted pain to the muscle body of the hamstring and no pain with stretching of the hamstring musculature.  No saddle paresthesias, loss of bowel or bladder function.  Patient's back pain has completely resolved at this point. Plan: -We will provide referral for physical therapy as patient would likely benefit from stretching and some strengthening of the piriformis musculature as well as the surrounding musculature -We will provide patient prescription for ibuprofen 400 mg every 6 hours as needed for pain for the next 5 days as he does not have noted CKD or other contraindications. -Return precautions provided to patient

## 2020-01-18 NOTE — Patient Instructions (Signed)
It was great to see you!  Our plans for today:  -Today we discussed your right leg pain. -I would this pain is related to sciatica.  You also have some tightness of the right piriformis muscle.  Sometimes this muscle being tight can cause sciatica by itself. -I am prescribing ibuprofen which you can use every 6 hours as needed for pain for the next few days.  I am also sending in a referral for physical therapy as stretching and strengthening exercises often improve this. -Should you develop any numbness of your crotch region, loss of bowel or bladder function I would like for you to go to the emergency department.  If you develop any worsening pain I would like for you to schedule a follow-up appointment. -If you do not hear from physical therapy in the next 1 week I would like for you to let me know.   Take care and seek immediate care sooner if you develop any concerns.   Dr. [facility]   Sciatica  Sciatica is pain, weakness, tingling, or loss of feeling (numbness) along the sciatic nerve. The sciatic nerve starts in the lower back and goes down the back of each leg. Sciatica usually goes away on its own or with treatment. Sometimes, sciatica may come back (recur). What are the causes? This condition happens when the sciatic nerve is pinched or has pressure put on it. This may be the result of:  A disk in between the bones of the spine bulging out too far (herniated disk).  Changes in the spinal disks that occur with aging.  A condition that affects a muscle in the butt.  Extra bone growth near the sciatic nerve.  A break (fracture) of the area between your hip bones (pelvis).  Pregnancy.  Tumor. This is rare. What increases the risk? You are more likely to develop this condition if you:  Play sports that put pressure or stress on the spine.  Have poor strength and ease of movement (flexibility).  Have had a back injury in the past.  Have had back  surgery.  Sit for long periods of time.  Do activities that involve bending or lifting over and over again.  Are very overweight (obese). What are the signs or symptoms? Symptoms can vary from mild to very bad. They may include:  Any of these problems in the lower back, leg, hip, or butt: ? Mild tingling, loss of feeling, or dull aches. ? Burning sensations. ? Sharp pains.  Loss of feeling in the back of the calf or the sole of the foot.  Leg weakness.  Very bad back pain that makes it hard to move. These symptoms may get worse when you cough, sneeze, or laugh. They may also get worse when you sit or stand for long periods of time. How is this treated? This condition often gets better without any treatment. However, treatment may include:  Changing or cutting back on physical activity when you have pain.  Doing exercises and stretching.  Putting ice or heat on the affected area.  Medicines that help: ? To relieve pain and swelling. ? To relax your muscles.  Shots (injections) of medicines that help to relieve pain, irritation, and swelling.  Surgery. Follow these instructions at home: Medicines  Take over-the-counter and prescription medicines only as told by your doctor.  Ask your doctor if the medicine prescribed to you: ? Requires you to avoid driving or using heavy machinery. ? Can cause trouble  pooping (constipation). You may need to take these steps to prevent or treat trouble pooping:  Drink enough fluids to keep your pee (urine) pale yellow.  Take over-the-counter or prescription medicines.  Eat foods that are high in fiber. These include beans, whole grains, and fresh fruits and vegetables.  Limit foods that are high in fat and sugar. These include fried or sweet foods. Managing pain      If told, put ice on the affected area. ? Put ice in a plastic bag. ? Place a towel between your skin and the bag. ? Leave the ice on for 20 minutes, 2-3 times a  day.  If told, put heat on the affected area. Use the heat source that your doctor tells you to use, such as a moist heat pack or a heating pad. ? Place a towel between your skin and the heat source. ? Leave the heat on for 20-30 minutes. ? Remove the heat if your skin turns bright red. This is very important if you are unable to feel pain, heat, or cold. You may have a greater risk of getting burned. Activity   Return to your normal activities as told by your doctor. Ask your doctor what activities are safe for you.  Avoid activities that make your symptoms worse.  Take short rests during the day. ? When you rest for a long time, do some physical activity or stretching between periods of rest. ? Avoid sitting for a long time without moving. Get up and move around at least one time each hour.  Exercise and stretch regularly, as told by your doctor.  Do not lift anything that is heavier than 10 lb (4.5 kg) while you have symptoms of sciatica. ? Avoid lifting heavy things even when you do not have symptoms. ? Avoid lifting heavy things over and over.  When you lift objects, always lift in a way that is safe for your body. To do this, you should: ? Bend your knees. ? Keep the object close to your body. ? Avoid twisting. General instructions  Stay at a healthy weight.  Wear comfortable shoes that support your feet. Avoid wearing high heels.  Avoid sleeping on a mattress that is too soft or too hard. You might have less pain if you sleep on a mattress that is firm enough to support your back.  Keep all follow-up visits as told by your doctor. This is important. Contact a doctor if:  You have pain that: ? Wakes you up when you are sleeping. ? Gets worse when you lie down. ? Is worse than the pain you have had in the past. ? Lasts longer than 4 weeks.  You lose weight without trying. Get help right away if:  You cannot control when you pee (urinate) or poop (have a bowel  movement).  You have weakness in any of these areas and it gets worse: ? Lower back. ? The area between your hip bones. ? Butt. ? Legs.  You have redness or swelling of your back.  You have a burning feeling when you pee. Summary  Sciatica is pain, weakness, tingling, or loss of feeling (numbness) along the sciatic nerve.  This condition happens when the sciatic nerve is pinched or has pressure put on it.  Sciatica can cause pain, tingling, or loss of feeling (numbness) in the lower back, legs, hips, and butt.  Treatment often includes rest, exercise, medicines, and putting ice or heat on the affected area. This information  is not intended to replace advice given to you by your health care provider. Make sure you discuss any questions you have with your health care provider. Document Revised: 06/07/2018 Document Reviewed: 06/07/2018 Elsevier Patient Education  [place].

## 2020-02-02 ENCOUNTER — Ambulatory Visit: Payer: Medicaid Other | Attending: Family Medicine

## 2020-02-02 ENCOUNTER — Other Ambulatory Visit: Payer: Self-pay

## 2020-02-02 DIAGNOSIS — M25551 Pain in right hip: Secondary | ICD-10-CM | POA: Diagnosis not present

## 2020-02-02 DIAGNOSIS — R252 Cramp and spasm: Secondary | ICD-10-CM

## 2020-02-02 DIAGNOSIS — M6281 Muscle weakness (generalized): Secondary | ICD-10-CM | POA: Diagnosis present

## 2020-02-02 DIAGNOSIS — R293 Abnormal posture: Secondary | ICD-10-CM | POA: Diagnosis present

## 2020-02-02 DIAGNOSIS — M79651 Pain in right thigh: Secondary | ICD-10-CM | POA: Diagnosis present

## 2020-02-02 NOTE — Patient Instructions (Signed)
Piriformis and LTR stretch x 5 3x/day x 15-30 sec
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
## 2020-02-14 ENCOUNTER — Ambulatory Visit: Payer: Medicaid Other

## 2020-02-16 ENCOUNTER — Ambulatory Visit: Payer: Medicaid Other

## 2020-02-21 ENCOUNTER — Telehealth: Payer: Self-pay | Admitting: Physical Therapy

## 2020-02-21 ENCOUNTER — Ambulatory Visit: Payer: Medicaid Other

## 2020-02-21 NOTE — Telephone Encounter (Signed)
Spoke to pt and he though appointment was on Wednesday the 22nd. Informed him of next appointment on the 27th at 245. He said he would make that appointment.

## 2020-02-27 ENCOUNTER — Ambulatory Visit: Payer: Medicaid Other

## 2020-08-23 ENCOUNTER — Ambulatory Visit (INDEPENDENT_AMBULATORY_CARE_PROVIDER_SITE_OTHER): Payer: Medicaid Other | Admitting: Family Medicine

## 2020-08-23 ENCOUNTER — Other Ambulatory Visit: Payer: Self-pay

## 2020-08-23 ENCOUNTER — Encounter: Payer: Self-pay | Admitting: Family Medicine

## 2020-08-23 VITALS — BP 130/70 | HR 78 | Ht 71.0 in | Wt 217.8 lb

## 2020-08-23 DIAGNOSIS — K6289 Other specified diseases of anus and rectum: Secondary | ICD-10-CM | POA: Diagnosis not present

## 2020-08-23 DIAGNOSIS — Z23 Encounter for immunization: Secondary | ICD-10-CM

## 2020-08-23 DIAGNOSIS — K644 Residual hemorrhoidal skin tags: Secondary | ICD-10-CM

## 2020-08-23 DIAGNOSIS — K602 Anal fissure, unspecified: Secondary | ICD-10-CM

## 2020-08-23 MED ORDER — PRAMOXINE-HC 1-1 % EX CREA: TOPICAL_CREAM | Freq: Three times a day (TID) | CUTANEOUS | 0 refills | Status: DC

## 2020-08-23 MED ORDER — POLYETHYLENE GLYCOL 3350 17 GM/SCOOP PO POWD: 17.0000 g | Freq: Every day | ORAL | 2 refills | Status: AC

## 2020-08-23 MED ORDER — NITROGLYCERIN 0.4 % RE OINT: 1.0000 [in_us] | TOPICAL_OINTMENT | Freq: Two times a day (BID) | RECTAL | 0 refills | Status: DC

## 2020-08-23 NOTE — Patient Instructions (Signed)
Nitroglycerin cream - 1 inch on gloved finger, insert finger into rectum about1 inch, apply all around inside of rectum.  Do this twice a day until pain is gone.  Do not use for more than 3 weeks.   Use the Hemorrhoid cream, pramoxine-hydrocortisone, up to three times a day for pain or itching of anus or rectum.   Keep your stool soft by taking the powder Miralax, one capful dissolved in 8 ounces of liquids.  Drink once a day.  Sit in warm water bath twice a day for at least 15 minutes.  This will help sooth the skin around your bottom.  Do this for a week at least.        Anal Fissure, Adult  An anal fissure is a small tear or crack in the tissue around the opening of the butt (anus). Bleeding from the tear or crack usually stops on its own within a few minutes. The bleeding may happen every time you poop (have a bowel movement) until the tear or crack heals. What are the causes? This condition is usually caused by passing a large or hard poop (stool). Other causes include:  Trouble pooping (constipation).  Passing watery poop (diarrhea).  Inflammatory bowel disease (Crohn's disease or ulcerative colitis).  Childbirth.  Infections.  Anal sex. What are the signs or symptoms? Symptoms of this condition include:  Bleeding from the butt.  Small amounts of blood on your poop. The blood coats the outside of the poop. It is not mixed with the poop.  Small amounts of blood on the toilet paper or in the toilet after you poop.  Pain when passing poop.  Itching or irritation around the opening of the butt. How is this diagnosed? This condition may be diagnosed based on a physical exam. Your doctor may:  Check your butt. A tear can often be seen by checking the area with care.  Check your butt using a short tube (anoscope). The light in the tube will show any problems in your butt. How is this treated? Treatment for this condition may include:  Treating problems that make it  hard for you to pass poop. You may be told to: ? Eat more fiber. ? Drink more fluid. ? Take fiber supplements. ? Take medicines that make poop soft.  Taking sitz baths. This may help to heal the tear.  Using creams and ointments. If your condition gets worse, other treatments may be needed such as:  A shot near the tear or crack (botulinum injection).  Surgery to repair the tear or crack. Follow these instructions at home: Eating and drinking  Avoid bananas and dairy products. These foods can make it hard to poop.  Drink enough fluid to keep your pee (urine) pale yellow.  Eat foods that have a lot of fiber in them, such as: ? Beans. ? Whole grains. ? Fresh fruits. ? Fresh vegetables.   General instructions  Take over-the-counter and prescription medicines only as told by your doctor.  Use creams or ointments only as told by your doctor.  Keep the butt area as clean and dry as you can.  Take a warm water bath (sitz bath) as told by your doctor. Do not use soap.  Keep all follow-up visits as told by your doctor. This is important.   Contact a doctor if:  You have more bleeding.  You have a fever.  You have watery poop that is mixed with blood.  You have pain.  Your problem  gets worse, not better. Summary  An anal fissure is a small tear or crack in the skin around the opening of the butt (anus).  This condition is usually caused by passing a large or hard poop (stool).  Treatment includes treating the problems that make it hard for you to pass poop.  Follow your doctor's instructions about caring for your condition at home.  Keep all follow-up visits as told by your doctor. This is important. This information is not intended to replace advice given to you by your health care provider. Make sure you discuss any questions you have with your health care provider. Document Revised: 10/29/2017 Document Reviewed: 10/29/2017 Elsevier Patient Education  [place].

## 2020-08-24 ENCOUNTER — Encounter: Payer: Self-pay | Admitting: Family Medicine

## 2020-08-24 DIAGNOSIS — K6289 Other specified diseases of anus and rectum: Secondary | ICD-10-CM | POA: Insufficient documentation

## 2020-08-24 NOTE — Assessment & Plan Note (Signed)
New complaint  Onset: 2 months ago Location: End of tail bone, "in my bottom" Quality: sharp Severity: moderate Function: painful defecation  Pattern: intermittent, with defecation and spontaneously Course: unchanged Radiation: none Relief: none Precipitant: constipation Assocaited symproms of itching and burning sensation around anus Prior Diagnostic Testing or Treatments: Anoscopic diagnosis internal hemorrhoids by Dr Ether Griffins 2018 Relevant PMH/PSH: symptom control with Analpram in past  Exam Anus: No overt lesions of anus or gluteal cleft. No palpable masses of cleft or ext anus TTP 12 O'clock position of internal anus Anoscopy: Tender to insertion thru anus.  No lesions visualize.   ASSESSMENT AND PLAN Working explanation is an anal fissure based on description of pain, its location, and TTP at 12'Oclock position. No fissure seen on anoscopy.  treatment NTG oint TWICE A DAY topical to external and internal ansu - teaching about application given.  Continue until pain resolves. Do not use more than three weeks.  Topical Pramoxine-HC three times a day prn itching, burning, pain Miralax capful daily Sitz baths twice a day for next week.

## 2020-08-24 NOTE — Progress Notes (Signed)
Derrick Ortiz is alone Sources of clinical information for visit is/are patient. Nursing assessment for this office visit was reviewed with the patient for accuracy and revision.     Previous Report(s) Reviewed: office notes  Depression screen [facility] 2/9 08/23/2020  Decreased Interest 0  Down, Depressed, Hopeless 0  PHQ - 2 Score 0  Altered sleeping 0  Tired, decreased energy 0  Change in appetite 0  Feeling bad or failure about yourself  0  Trouble concentrating 0  Moving slowly or fidgety/restless 0  Suicidal thoughts 0  PHQ-9 Score 0  Difficult doing work/chores Not difficult at all    Fall Risk  01/18/2020 12/08/2019 06/15/2019 04/28/2018  Falls in the past year? 0 0 0 0  Number falls in past yr: 0 0 0 -  Injury with Fall? 0 - - -  Follow up - Falls evaluation completed - -    PHQ9 SCORE ONLY 08/23/2020 12/08/2019 06/15/2019  PHQ-9 Total Score 0 0 5    Adult vaccines due  Topic Date Due  . TETANUS/TDAP  08/24/2030    Health Maintenance Due  Topic Date Due  . COLONOSCOPY (Pts 45-39yrs Insurance coverage will need to be confirmed)  Never done      History/P.E. limitations: none  Adult vaccines due  Topic Date Due  . TETANUS/TDAP  08/24/2030   There are no preventive care reminders to display for this patient.  Health Maintenance Due  Topic Date Due  . COLONOSCOPY (Pts 45-50yrs Insurance coverage will need to be confirmed)  Never done     Chief Complaint  Patient presents with  . Rectal Pain    Pt stated that he has be having sharp pains for a couple of weeks now.

## 2021-07-18 ENCOUNTER — Other Ambulatory Visit: Payer: Self-pay

## 2021-07-18 ENCOUNTER — Encounter: Payer: Self-pay | Admitting: Family Medicine

## 2021-07-18 ENCOUNTER — Ambulatory Visit (INDEPENDENT_AMBULATORY_CARE_PROVIDER_SITE_OTHER): Payer: Medicaid Other | Admitting: Family Medicine

## 2021-07-18 VITALS — BP 148/97 | HR 94 | Ht 71.0 in | Wt 223.2 lb

## 2021-07-18 DIAGNOSIS — Z0000 Encounter for general adult medical examination without abnormal findings: Secondary | ICD-10-CM

## 2021-07-18 DIAGNOSIS — Z720 Tobacco use: Secondary | ICD-10-CM

## 2021-07-18 DIAGNOSIS — M7061 Trochanteric bursitis, right hip: Secondary | ICD-10-CM | POA: Diagnosis not present

## 2021-07-18 DIAGNOSIS — I700 Atherosclerosis of aorta: Secondary | ICD-10-CM | POA: Diagnosis not present

## 2021-07-18 DIAGNOSIS — Z1212 Encounter for screening for malignant neoplasm of rectum: Secondary | ICD-10-CM

## 2021-07-18 DIAGNOSIS — Z1211 Encounter for screening for malignant neoplasm of colon: Secondary | ICD-10-CM

## 2021-07-18 DIAGNOSIS — E7800 Pure hypercholesterolemia, unspecified: Secondary | ICD-10-CM

## 2021-07-18 MED ORDER — METHYLPREDNISOLONE ACETATE 40 MG/ML IJ SUSP
40.0000 mg | Freq: Once | INTRAMUSCULAR | Status: AC
  Administered 2021-07-18: 40 mg via INTRAMUSCULAR

## 2021-07-18 NOTE — Patient Instructions (Addendum)
We injected some numbing medicine and steroids into your right hip bursa to treat it for possible hip bursitis.     We will call you next Tuesday to see if the pain has improved.     We are checking your cholesterol today to see what dose of cholesterol lowering medicine you need.    [facility] will call you within the next 5 business days to schedule you for the colon cancer screening with colonoscopy.

## 2021-07-19 ENCOUNTER — Encounter: Payer: Self-pay | Admitting: Family Medicine

## 2021-07-19 DIAGNOSIS — Z0000 Encounter for general adult medical examination without abnormal findings: Secondary | ICD-10-CM | POA: Insufficient documentation

## 2021-07-19 LAB — LIPID PANEL
Chol/HDL Ratio: 5.2 ratio — ABNORMAL HIGH (ref 0.0–5.0)
Cholesterol, Total: 192 mg/dL (ref 100–199)
HDL: 37 mg/dL — ABNORMAL LOW (ref >39)
LDL Chol Calc (NIH): 140 mg/dL — ABNORMAL HIGH (ref 0–99)
Triglycerides: 80 mg/dL (ref 0–149)
VLDL Cholesterol Cal: 15 mg/dL (ref 5–40)

## 2021-07-19 MED ORDER — ATORVASTATIN CALCIUM 20 MG PO TABS: 20.0000 mg | ORAL_TABLET | Freq: Every day | ORAL | 3 refills | Status: DC

## 2021-07-19 NOTE — Progress Notes (Addendum)
Patient ID: Derrick Ortiz, male   DOB: September 30, 1970, 51 y.o.   MRN: [id number] Derrick Ortiz is alone Sources of clinical information for visit is/are patient. Nursing assessment for this office visit was reviewed with the patient for accuracy and revision.  Physical Exam form reviewed.   Previous Report(s) Reviewed: none  Depression screen [facility] 2/9 07/18/2021  Decreased Interest 1  Down, Depressed, Hopeless 1  PHQ - 2 Score 2  Altered sleeping 2  Tired, decreased energy 1  Change in appetite 0  Feeling bad or failure about yourself  1  Trouble concentrating 1  Moving slowly or fidgety/restless 1  Suicidal thoughts -  PHQ-9 Score 8  Difficult doing work/chores -   [name] Office Visit from 07/18/2021 in [place] Office Visit from 08/23/2020 in [place] Office Visit from 09/26/2014 in [place]  Thoughts that you would be better off dead, or of hurting yourself in some way -- Not at all Not at all  PHQ-9 Total Score 8 0 21       Fall Risk  01/18/2020 12/08/2019 06/15/2019 04/28/2018  Falls in the past year? 0 0 0 0  Number falls in past yr: 0 0 0 -  Injury with Fall? 0 - - -  Follow up - Falls evaluation completed - -    PHQ9 SCORE ONLY 07/18/2021 08/23/2020 12/08/2019  PHQ-9 Total Score 8 0 0    Adult vaccines due  Topic Date Due   TETANUS/TDAP  08/24/2030    Health Maintenance Due  Topic Date Due   COLONOSCOPY (Pts 45-24yrs Insurance coverage will need to be confirmed)  Never done      History/P.E. limitations: none  Adult vaccines due  Topic Date Due   TETANUS/TDAP  08/24/2030   There are no preventive care reminders to display for this patient.  Health Maintenance Due  Topic Date Due   COLONOSCOPY (Pts 45-52yrs Insurance coverage will need to be confirmed)  Never done     Chief Complaint  Patient presents with   Annual Exam

## 2021-07-19 NOTE — Assessment & Plan Note (Signed)
Established problem Well Controlled. Eutensive, restarting statin, counseled on smoking cessation Check A1c next ov

## 2021-07-19 NOTE — Assessment & Plan Note (Signed)
New problem Onset about month ago. Location is over right lateral hip  Worse with standing or walking Better with sitting or lying Patient is not emplyed.  Physical exam finds TTP right greater trochanter  Procedure : Consent obtained Right greater trochanter prepped betadine  Injected  To depth just above greater troch prominence. Injection 4 cc lidocaine 1% and 40 mg Depo-Medrol Patient tolerated well, no complications.   Will f/u to see response

## 2021-07-19 NOTE — Assessment & Plan Note (Signed)
patient referred for CRC screening by colonoscopy

## 2021-07-19 NOTE — Assessment & Plan Note (Signed)
Established problem Uncontrolled.  patient had stopped atorvastatin 20 mg daily. He wants to restart Start: atorvastatin 20 mg daily.

## 2021-07-19 NOTE — Assessment & Plan Note (Signed)
Established problem Contemplative phase of change Service and support offered should he choose to quit

## 2021-07-29 ENCOUNTER — Encounter: Payer: Self-pay | Admitting: Gastroenterology

## 2021-08-14 ENCOUNTER — Other Ambulatory Visit: Payer: Self-pay

## 2021-08-14 ENCOUNTER — Ambulatory Visit (AMBULATORY_SURGERY_CENTER): Payer: Medicaid Other | Admitting: *Deleted

## 2021-08-14 VITALS — Ht 71.0 in | Wt 223.0 lb

## 2021-08-14 DIAGNOSIS — Z1211 Encounter for screening for malignant neoplasm of colon: Secondary | ICD-10-CM

## 2021-08-14 MED ORDER — PEG 3350-KCL-NA BICARB-NACL 420 G PO SOLR: 4000.0000 mL | Freq: Once | ORAL | 0 refills | Status: AC

## 2021-08-14 NOTE — Progress Notes (Signed)
Patient's pre-visit was done today over the phone with the patient. Name,DOB and address verified. Patient denies any allergies to Eggs and Soy. Patient denies any problems with anesthesia/sedation. Patient is not taking any diet pills or blood thinners. No home Oxygen.  ? ?Prep instructions mailed to pt-pt aware. Patient understands to call us back with any questions or concerns. Patient is aware of our care-partner policy and [facility] safety protocol.  ? ?

## 2021-09-04 ENCOUNTER — Encounter: Payer: Self-pay | Admitting: Gastroenterology

## 2021-09-04 ENCOUNTER — Ambulatory Visit (AMBULATORY_SURGERY_CENTER): Payer: Medicaid Other | Admitting: Gastroenterology

## 2021-09-04 VITALS — BP 140/99 | HR 78 | Temp 98.5°F | Resp 21 | Ht 71.0 in | Wt 223.0 lb

## 2021-09-04 DIAGNOSIS — Z1211 Encounter for screening for malignant neoplasm of colon: Secondary | ICD-10-CM | POA: Diagnosis not present

## 2021-09-04 DIAGNOSIS — K635 Polyp of colon: Secondary | ICD-10-CM

## 2021-09-04 DIAGNOSIS — D125 Benign neoplasm of sigmoid colon: Secondary | ICD-10-CM

## 2021-09-04 MED ORDER — SODIUM CHLORIDE 0.9 % IV SOLN: 500.0000 mL | Freq: Once | INTRAVENOUS | Status: DC

## 2021-09-04 NOTE — Progress Notes (Signed)
Report to PACU, RN, vss, BBS= Clear.  

## 2021-09-04 NOTE — Patient Instructions (Addendum)
HANDOUTS PROVIDED ON: POLYPS ? ?The polyp removed today have been sent for pathology.  The results can take 1-3 weeks to receive.   ? ?You may resume your previous diet and medication schedule. ? ?Thank you for allowing [place] to care for you today!!! ? ? ?YOU HAD AN ENDOSCOPIC PROCEDURE TODAY AT [facility]:   Refer to the procedure report that was given to you for any specific questions about what was found during the examination.  If the procedure report does not answer your questions, please call your gastroenterologist to clarify.  If you requested that your care partner not be given the details of your procedure findings, then the procedure report has been included in a sealed envelope for you to review at your convenience later. ? ?YOU SHOULD EXPECT: Some feelings of bloating in the abdomen. Passage of more gas than usual.  Walking can help get rid of the air that was put into your GI tract during the procedure and reduce the bloating. If you had a lower endoscopy (such as a colonoscopy or flexible sigmoidoscopy) you may notice spotting of blood in your stool or on the toilet paper. If you underwent a bowel prep for your procedure, you may not have a normal bowel movement for a few days. ? ?Please Note:  You might notice some irritation and congestion in your nose or some drainage.  This is from the oxygen used during your procedure.  There is no need for concern and it should clear up in a day or so. ? ?SYMPTOMS TO REPORT IMMEDIATELY: ? ?Following lower endoscopy (colonoscopy or flexible sigmoidoscopy): ? Excessive amounts of blood in the stool ? Significant tenderness or worsening of abdominal pains ? Swelling of the abdomen that is new, acute ? Fever of 100?F or higher ? ?For urgent or emergent issues, a gastroenterologist can be reached at any hour by calling [phone]. ?Do not use MyChart messaging for urgent concerns.  ? ? ?DIET:  We do recommend a small meal at first, but then you may  proceed to your regular diet.  Drink plenty of fluids but you should avoid alcoholic beverages for 24 hours. ? ?ACTIVITY:  You should plan to take it easy for the rest of today and you should NOT DRIVE or use heavy machinery until tomorrow (because of the sedation medicines used during the test).   ? ?FOLLOW UP: ?Our staff will call the number listed on your records Monday morning between 7:15 am and 8:15 am following your procedure to check on you and address any questions or concerns that you may have regarding the information given to you following your procedure. If we do not reach you, we will leave a message.  We will attempt to reach you two times.  During this call, we will ask if you have developed any symptoms of COVID 19. If you develop any symptoms (ie: fever, flu-like symptoms, shortness of breath, cough etc.) before then, please call [phone].  If you test positive for Covid 19 in the 2 weeks post procedure, please call and report this information to [place].   ? ?If any biopsies were taken you will be contacted by phone or by letter within the next 1-3 weeks.  Please call us at [phone] if you have not heard about the biopsies in 3 weeks.  ? ? ?SIGNATURES/CONFIDENTIALITY: ?You and/or your care partner have signed paperwork which will be entered into your electronic medical record.  These signatures attest to  the fact that that the information above on your After Visit Summary has been reviewed and is understood.  Full responsibility of the confidentiality of this discharge information lies with you and/or your care-partner. ? ?

## 2021-09-04 NOTE — Op Note (Signed)
[facility] ?Patient Name: Derrick Ortiz ?Procedure Date: 09/04/2021 11:58 AM ?MRN: [id number] ?Endoscopist: Leocadio Heal E. Candis Schatz , MD ?Age: 51 ?Referring MD:  ?Date of Birth: 05/01/1971 ?Gender: Male ?Account #: [id number] ?Procedure:                Colonoscopy ?Indications:              Screening for colorectal malignant neoplasm, This  ?                          is the patient's first colonoscopy ?Medicines:                Monitored Anesthesia Care ?Procedure:                Pre-Anesthesia Assessment: ?                          - Prior to the procedure, a History and Physical  ?                          was performed, and patient medications and  ?                          allergies were reviewed. The patient's tolerance of  ?                          previous anesthesia was also reviewed. The risks  ?                          and benefits of the procedure and the sedation  ?                          options and risks were discussed with the patient.  ?                          All questions were answered, and informed consent  ?                          was obtained. Prior Anticoagulants: The patient has  ?                          taken no previous anticoagulant or antiplatelet  ?                          agents. ASA Grade Assessment: III - A patient with  ?                          severe systemic disease. After reviewing the risks  ?                          and benefits, the patient was deemed in  ?                          satisfactory condition to undergo the procedure. ?  After obtaining informed consent, the colonoscope  ?                          was passed under direct vision. Throughout the  ?                          procedure, the patient's blood pressure, pulse, and  ?                          oxygen saturations were monitored continuously. The  ?                          Olympus CF-HQ190L (#[id number]) Colonoscope was  ?                          introduced through the  anus and advanced to the the  ?                          cecum, identified by the ileocecal valve. The  ?                          colonoscopy was performed without difficulty. The  ?                          patient tolerated the procedure well. The quality  ?                          of the bowel preparation was poor. The ileocecal  ?                          valve was photographed. ?Scope In: 12:05:24 PM ?Scope Out: 12:16:44 PM ?Scope Withdrawal Time: 0 hours 4 minutes 9 seconds  ?Total Procedure Duration: 0 hours 11 minutes 20 seconds  ?Findings:                 The perianal and digital rectal examinations were  ?                          normal. Pertinent negatives include normal  ?                          sphincter tone and no palpable rectal lesions. ?                          A 15 mm polyp was found in the sigmoid colon. The  ?                          polyp was erythematous and pedunculated. A single  ?                          biopsy was taken with a cold forceps for histology.  ?                          Estimated blood loss was minimal. ?  The exam was otherwise normal throughout the  ?                          examined colon. ?Complications:            No immediate complications. ?Estimated Blood Loss:     Estimated blood loss was minimal. ?Impression:               - Preparation of the colon was poor. ?                          - One erythematous 15 mm polyp in the sigmoid  ?                          colon. Biopsied. ?Recommendation:           - Patient has a contact number available for  ?                          emergencies. The signs and symptoms of potential  ?                          delayed complications were discussed with the  ?                          patient. Return to normal activities tomorrow.  ?                          Written discharge instructions were provided to the  ?                          patient. ?                          - Resume previous diet. ?                           - Continue present medications. ?                          - Await pathology results. ?                          - Repeat colonoscopy at the next available  ?                          appointment because the bowel preparation was poor  ?                          and to remove sigmoid polyp if biopsies confirm  ?                          adenoma. ?                          - Reinforce importance of following bowel prep  ?  instructions with patient. ?Rommie Dunn E. Candis Schatz, MD ?09/04/2021 12:24:40 PM ?This report has been signed electronically. ?

## 2021-09-04 NOTE — Progress Notes (Signed)
Called to room to assist during endoscopic procedure.  Patient ID and intended procedure confirmed with present staff. Received instructions for my participation in the procedure from the performing physician.  

## 2021-09-04 NOTE — Progress Notes (Signed)
VS by CW  Pt's states no medical or surgical changes since previsit or office visit.  
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
## 2021-09-04 NOTE — Progress Notes (Signed)
Per MD request patient repeat colonoscopy scheduled for 5/22 '@9'$ :30 am.  Instructions printed and gone over with patient in depth.  The importance of following the directions strictly was stressed.  Patient verbalized understanding and agrees to call if any questions come up. ?

## 2021-09-09 ENCOUNTER — Telehealth: Payer: Self-pay | Admitting: *Deleted

## 2021-09-09 NOTE — Telephone Encounter (Signed)
?  Follow up Call- ? ? ?  09/04/2021  ? 10:44 AM  ?Call back number  ?Post procedure Call Back phone  # [phone]  ?Permission to leave phone message Yes  ?  ? ?Patient questions: ? ?Do you have a fever, pain , or abdominal swelling? No. ?Pain Score  0 * ? ?Have you tolerated food without any problems? Yes.   ? ?Have you been able to return to your normal activities? Yes.   ? ?Do you have any questions about your discharge instructions: ?Diet   No. ?Medications  No. ?Follow up visit  No. ? ?Do you have questions or concerns about your Care? No. ? ?Actions: ?* If pain score is 4 or above: ?No action needed, pain <4. ? ? ?

## 2021-09-10 ENCOUNTER — Encounter: Payer: Self-pay | Admitting: Gastroenterology

## 2021-09-24 ENCOUNTER — Encounter: Payer: Self-pay | Admitting: Student

## 2021-09-24 ENCOUNTER — Ambulatory Visit (INDEPENDENT_AMBULATORY_CARE_PROVIDER_SITE_OTHER): Payer: Medicaid Other | Admitting: Student

## 2021-09-24 VITALS — BP 140/86 | HR 81 | Ht 71.0 in | Wt 218.0 lb

## 2021-09-24 DIAGNOSIS — M7061 Trochanteric bursitis, right hip: Secondary | ICD-10-CM

## 2021-09-24 DIAGNOSIS — M5430 Sciatica, unspecified side: Secondary | ICD-10-CM

## 2021-09-24 MED ORDER — MELOXICAM 7.5 MG PO TABS
7.5000 mg | ORAL_TABLET | Freq: Every day | ORAL | 0 refills | Status: AC
Start: 2021-09-24 — End: 2021-10-24

## 2021-09-24 MED ORDER — GABAPENTIN 100 MG PO CAPS: 100.0000 mg | ORAL_CAPSULE | Freq: Three times a day (TID) | ORAL | 0 refills | Status: DC

## 2021-09-24 NOTE — Progress Notes (Addendum)
? ? ?  SUBJECTIVE:  ? ?CHIEF COMPLAINT / HPI:  ? ?51 year old with history of sciatica from 2021 presents today due to continue nerve pain on the right lower extremity. Patient denies any back pain or incontinence but reports sever pain on his right lateral thigh, calf tightness and paresthesia of his right foot. Patient said pain is constant and rates it 10/10. He reports feeling pins and needles on his right foot, this is new, it started 2-3 weeks ago.  Deneis any recent fall. Nothing seems to have relieved his pain and he reports doing physical therapy which provided minimal relieve, otherwise nothing else has helped in the past. He is currently not working on disability.  ? ?PERTINENT  PMH / PSH: Lower back pain, muscular atrophy of left leg with peripheral neuropathy ? ?OBJECTIVE:  ? ?BP 140/86   Pulse 81   Ht '5\' 11"'$  (1.803 m)   Wt 218 lb (98.9 kg)   SpO2 98%   BMI 30.40 kg/m?   ? ?Physical Exam ?General: Alert, well appearing, NAD ?Respiratory: Normal WOB on RA ?Abdomen: No distension or tenderness ?Extremities: No edema on extremities, muscle strength is 5/5 on all extremities and sensation intact.   ? ?ASSESSMENT/PLAN:  ? ?Greater trochanteric bursitis of right hip ?Patient report new onset of paresthesia of the right foot with calf tightness consistent with sciatica. Recent steroid injection provided no relieve and ibuprofen has been ineffective for pain control.  ?-Referral for physical therapy ?-Recommend continuous ambulation and movement ?-Advised use of topicals such as lidocaine patch  ?-Obtain lumbar spine imaging ?- Rx gabapentin and meloxicam  ?-Follow up in 4 weeks  ?  ? ? ?Alen Bleacher, MD ?[place]  ? ? ?

## 2021-09-24 NOTE — Assessment & Plan Note (Addendum)
Patient report new onset of paresthesia of the right foot with calf tightness consistent with sciatica. Recent steroid injection provided no relieve and ibuprofen has been ineffective for pain control.  ?-Referral for physical therapy ?-Recommend continuous ambulation and movement ?-Advised use of topicals such as lidocaine patch  ?-Obtain lumbar spine imaging ?- Rx gabapentin and meloxicam  ?-Follow up in 4 weeks  ?

## 2021-09-24 NOTE — Patient Instructions (Addendum)
It was wonderful to meet you today. Thank you for allowing me to be a part of your care. Below is a short summary of what we discussed at your visit today: ? ?You pain is consistent with sciatica pain ? ?I have place referral for Physical therapy and order for Back X-ray ? ?Sent in prescription for Gabapentin and Meloxicam ? ?Follow up with your  PCP in 4 weeks after you've started Physical therapy ? ?If you have any questions or concerns, please do not hesitate to contact us via phone or MyChart message.  ? ?Alen Bleacher, MD ?[facility]  ?

## 2021-10-10 ENCOUNTER — Ambulatory Visit: Payer: Medicaid Other

## 2021-10-11 NOTE — Progress Notes (Signed)
pre ? ? ?SUBJECTIVE:  ? ?CHIEF COMPLAINT / HPI:  ? ?Back pain-Mr. Wuertz notes that 6 months ago he was throwing a football with his son and the next day the right side of his back started hurting, he notes this last for 2 to 3 days and then resolved.  A couple days later he started having pain on the right side that went down the side of his leg starting in his buttocks and then went all the way down to the foot.  He notes that has been persistent but has been getting worse recently.  He also has paresthesias and feels like "a pressure in someone is stabbing me in the leg."  He denies a history of back pain or hip pain in the past and denies any previous surgeries or back trauma.  He has tried lidocaine patches which he feels like does not help.  He also tried meloxicam and gabapentin for a day, he felt like he got some relief but then started having more pain on the right side so he stopped the meloxicam.  He continued the gabapentin on his own but did not notice any pain relief.  He was taking the 300 mg 3 times a day.  He denies any bowel or bladder incontinence, fevers or chills, or saddle paresthesias.  He denies any new leg numbness or weakness.  He denies a history of GI bleed or peptic ulcers.  He is open to physical therapy but is in so much pain now he feels like he cannot do any exercises. ? ? ?OBJECTIVE:  ? ?BP 136/84   Pulse 77   Ht '5\' 11"'$  (1.803 m)   Wt 218 lb 3.2 oz (99 kg)   SpO2 99%   BMI 30.43 kg/m?   ?General: alert & oriented, no apparent distress, well groomed ?HEENT: normocephalic, atraumatic, EOM grossly intact, oral mucosa moist, neck supple ?Respiratory: normal respiratory effort ?GI: non-distended ?Skin: no rashes, no jaundice ?Psych: appropriate mood and affect ?MSK: no pain with log roll of right hip, tender to palpation in right buttock all the way down IT band and into the leg.  Bilateral patellar reflexes 2+.  Strength and sensation intact and symmetric in bilateral lower  extremities.  No skin, normal cap refill. ? ?ASSESSMENT/PLAN:  ? ?Sciatica of right side ?- Based on physical exam suspect sciatica ?- Agree with x-ray as this has been going on for 6 months, reassuringly no red flag signs or symptoms and discussed with him that these would be reasons to go to the ED ?- Neurologically intact on exam ?- 15 mg Toradol IM given today, will start prednisone 5-day burst tomorrow, discussed taking in the morning and risk of hyperactivity and heartburn and peptic ulcer and things to watch for ?Given exercises to do at home as well as a physical therapy referral, will make follow-up with PCP in 2 weeks ?- Discussed can continue gabapentin 300 mg 3 times daily after the steroid burst ?  ? ? ?Lenoria Chime, MD ?[place]  ? ?

## 2021-10-14 ENCOUNTER — Ambulatory Visit (INDEPENDENT_AMBULATORY_CARE_PROVIDER_SITE_OTHER): Payer: Medicaid Other | Admitting: Family Medicine

## 2021-10-14 ENCOUNTER — Encounter: Payer: Self-pay | Admitting: Family Medicine

## 2021-10-14 VITALS — BP 136/84 | HR 77 | Ht 71.0 in | Wt 218.2 lb

## 2021-10-14 DIAGNOSIS — M5431 Sciatica, right side: Secondary | ICD-10-CM | POA: Diagnosis present

## 2021-10-14 MED ORDER — PREDNISONE 20 MG PO TABS: 40.0000 mg | ORAL_TABLET | Freq: Every day | ORAL | 0 refills | Status: AC

## 2021-10-14 MED ORDER — KETOROLAC TROMETHAMINE 30 MG/ML IJ SOLN
30.0000 mg | Freq: Once | INTRAMUSCULAR | Status: AC
  Administered 2021-10-14: 15 mg via INTRAMUSCULAR

## 2021-10-14 NOTE — Patient Instructions (Addendum)
It was wonderful to see you today. ? ?Please bring ALL of your medications with you to every visit.  ? ?Today we talked about: ? ?For your back pain ?- Please go to [facility] to get your X-ray ?- We gave you a shot called Toradol '15mg'$  today ?- TOMORROW, start a steroid called prednisone '40mg'$  (2 tabs) every morning for 5 days ?- This can cause stomach upset/heartburn, but if you have severe abdominal pain or blood in stools or dark black stools stop taking and go to emergency room ?-  The prednisone can make you hungry and hyper so take in the morning ? ?- I have given you some exercises to do for your back, and also referred you to physical therapy ? ? ?Please make a follow up appointment in 2 weeks with your PCP ? ? ?Thank you for choosing [facility].  ? ?Please call [phone] with any questions about today's appointment. ? ?Please be sure to schedule follow up at the front  desk before you leave today.  ? ?Please arrive at least 15 minutes prior to your scheduled appointments. ?  ?If you had blood work today, I will send you a MyChart message or a letter if results are normal. Otherwise, I will give you a call. ?  ?If you had a referral placed, they will call you to set up an appointment. Please give [place] a call if you don't hear back in the next 2 weeks. ?  ?If you need additional refills before your next appointment, please call your pharmacy first.  ? ?Yehuda Savannah, MD  ?Family Medicine   ?

## 2021-10-14 NOTE — Assessment & Plan Note (Signed)
-   Based on physical exam suspect sciatica ?- Agree with x-ray as this has been going on for 6 months, reassuringly no red flag signs or symptoms and discussed with him that these would be reasons to go to the ED ?- Neurologically intact on exam ?- 15 mg Toradol IM given today, will start prednisone 5-day burst tomorrow, discussed taking in the morning and risk of hyperactivity and heartburn and peptic ulcer and things to watch for ?Given exercises to do at home as well as a physical therapy referral, will make follow-up with PCP in 2 weeks ?- Discussed can continue gabapentin 300 mg 3 times daily after the steroid burst ? ?

## 2021-10-21 ENCOUNTER — Encounter: Payer: Medicaid Other | Admitting: Gastroenterology

## 2021-11-20 ENCOUNTER — Encounter: Payer: Medicaid Other | Admitting: Gastroenterology

## 2021-11-21 ENCOUNTER — Ambulatory Visit: Payer: Medicaid Other | Admitting: Family Medicine

## 2021-11-21 NOTE — Progress Notes (Deleted)
    SUBJECTIVE:   CHIEF COMPLAINT / HPI:   ***  PERTINENT  PMH / PSH: ***  OBJECTIVE:   There were no vitals taken for this visit.  ***  ASSESSMENT/PLAN:   No problem-specific Assessment & Plan notes found for this encounter.     Gerlene Fee, [place]

## 2021-11-22 ENCOUNTER — Ambulatory Visit (INDEPENDENT_AMBULATORY_CARE_PROVIDER_SITE_OTHER): Payer: Medicaid Other | Admitting: Family Medicine

## 2021-11-22 ENCOUNTER — Other Ambulatory Visit: Payer: Self-pay

## 2021-11-22 ENCOUNTER — Ambulatory Visit (HOSPITAL_COMMUNITY)
Admission: RE | Admit: 2021-11-22 | Discharge: 2021-11-22 | Disposition: A | Discharge status: Home / Self Care | Payer: Medicaid Other | Source: Ambulatory Visit | Attending: Family Medicine | Admitting: Family Medicine

## 2021-11-22 ENCOUNTER — Encounter: Payer: Self-pay | Admitting: Family Medicine

## 2021-11-22 VITALS — BP 149/110 | HR 83 | Wt 216.4 lb

## 2021-11-22 DIAGNOSIS — M5430 Sciatica, unspecified side: Secondary | ICD-10-CM | POA: Diagnosis present

## 2021-11-22 DIAGNOSIS — M5431 Sciatica, right side: Secondary | ICD-10-CM | POA: Diagnosis present

## 2021-11-22 MED ORDER — KETOROLAC TROMETHAMINE 30 MG/ML IJ SOLN
30.0000 mg | Freq: Once | INTRAMUSCULAR | Status: AC
  Administered 2021-11-22: 30 mg via INTRAMUSCULAR

## 2021-11-22 MED ORDER — ACETAMINOPHEN 500 MG PO TABS: 1000.0000 mg | ORAL_TABLET | Freq: Four times a day (QID) | ORAL | 0 refills | Status: AC

## 2021-11-22 MED ORDER — KETOROLAC TROMETHAMINE 30 MG/ML IJ SOLN
15.0000 mg | Freq: Once | INTRAMUSCULAR | Status: AC
  Administered 2021-11-22: 15 mg via INTRAMUSCULAR

## 2021-11-22 MED ORDER — CYCLOBENZAPRINE HCL 10 MG PO TABS: 10.0000 mg | ORAL_TABLET | Freq: Three times a day (TID) | ORAL | 0 refills | Status: AC | PRN

## 2021-11-22 MED ORDER — KETOROLAC TROMETHAMINE 30 MG/ML IJ SOLN: 30.0000 mg | Freq: Once | INTRAMUSCULAR | Status: DC

## 2021-11-22 MED ORDER — LIDOCAINE 4 % EX PTCH: 1.0000 | MEDICATED_PATCH | CUTANEOUS | 0 refills | Status: AC

## 2021-11-22 MED ORDER — KETOROLAC TROMETHAMINE 15 MG/ML IJ SOLN: 15.0000 mg | Freq: Once | INTRAMUSCULAR | Status: DC

## 2021-11-22 MED ORDER — IBUPROFEN 600 MG PO TABS: 600.0000 mg | ORAL_TABLET | Freq: Three times a day (TID) | ORAL | 0 refills | Status: AC

## 2021-11-22 NOTE — Progress Notes (Signed)
     SUBJECTIVE:   CHIEF COMPLAINT / HPI:   Derrick Ortiz is a 51 y.o. male presents for follow up   Right-sided sciatica Pt seen on 5/15 for sciatica.  At this time he was prescribed a prednisone burst, gabapentin  300mg  TID and referred to PT. He was also given 15 mg injection of Toradol which improved his symptoms.  Since that visit patient reports he has on going "stabbing pains" radiating from his right buttock to his right foot.  Unfortunately the gabapentin does not help at all.  Patient had tried meloxicam prior to 5/15 which helped a bit.  Patient has not heard from physical therapy yet. He has also tried heat/ice, menthol patches etc but nothing is helping at all.  He can "barely walk" and pain severity is 10/10.  With the Toradol the pain was 5/10 .  Patient is on disability now. Does not use regular tylenol or ibuprofen.  He has been doing stretches. Did not get the lumbar spine xray's as he has been in too much pain.  Denies paresthesia or weakness of lower extremities.  Denies urinary/fecal incontinence, saddle anesthesia.  Flowsheet Row Office Visit from 11/22/2021 in [facility]  PHQ-9 Total Score 12      PERTINENT  PMH / PSH: AAA, ED, obesity  OBJECTIVE:   BP (!) 149/110   Pulse 83   Wt 216 lb 6.4 oz (98.2 kg)   SpO2 99%   BMI 30.18 kg/m    General: Alert, no acute distress Cardio: Well-perfused Pulm: normal work of breathing Neuro: Cranial nerves grossly intact 5/5 strength lower extremities, normal sensation throughout  Antalgic gait  Back Normal skin, Spine with normal alignment and no deformity.  Tenderness on palpation of lumbar spine and right buttock area. Range of motion is full at neck, limited lumbar range of motion due to pain.  Positive straight leg test on right  ASSESSMENT/PLAN:   Sciatica of right side Ongoing sciatica, unfortunately not improved with steroid burst or gabapentin.  No red flags for back pain.  Patient  received 15 mg Toradol injection in the clinic per his request.  Recommend: -Tylenol 1000 mg every 4-6 hours -Ibuprofen 600 mg every 8 hours -Lidocaine patch every 24 hours -Flexeril 5 mg up to 3 times a day -Continue exercises at home -Heat and ice as needed -Physical therapy  -Patient will get lumbar x-rays today follow-up with PCP -Return if no improvement in symptoms    Towanda Octave, MD PGY-3 [facility]

## 2021-12-13 ENCOUNTER — Ambulatory Visit: Payer: Medicaid Other | Attending: Family Medicine

## 2021-12-13 DIAGNOSIS — M6281 Muscle weakness (generalized): Secondary | ICD-10-CM | POA: Insufficient documentation

## 2021-12-13 DIAGNOSIS — R262 Difficulty in walking, not elsewhere classified: Secondary | ICD-10-CM | POA: Insufficient documentation

## 2021-12-13 DIAGNOSIS — M25551 Pain in right hip: Secondary | ICD-10-CM | POA: Insufficient documentation

## 2021-12-13 DIAGNOSIS — R252 Cramp and spasm: Secondary | ICD-10-CM | POA: Diagnosis present

## 2021-12-13 DIAGNOSIS — M5431 Sciatica, right side: Secondary | ICD-10-CM | POA: Insufficient documentation

## 2021-12-13 DIAGNOSIS — R293 Abnormal posture: Secondary | ICD-10-CM | POA: Diagnosis present

## 2021-12-13 DIAGNOSIS — M5459 Other low back pain: Secondary | ICD-10-CM | POA: Insufficient documentation

## 2021-12-13 DIAGNOSIS — M79651 Pain in right thigh: Secondary | ICD-10-CM | POA: Diagnosis present

## 2021-12-13 NOTE — Patient Instructions (Signed)
Access Code: [email or web address] URL: [email or web address] Date: 12/13/2021 Prepared by: Kathreen Cornfield  Exercises - Open Books  - 2 x daily - 7 x weekly - 1-2 sets - 10-15 reps - Lying Prone  - 2 x daily - 7 x weekly - 1 sets - 1-5 minutes hold
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
## 2021-12-19 ENCOUNTER — Ambulatory Visit (INDEPENDENT_AMBULATORY_CARE_PROVIDER_SITE_OTHER): Payer: Medicaid Other | Admitting: Family Medicine

## 2021-12-19 VITALS — BP 147/102 | HR 92 | Wt 212.6 lb

## 2021-12-19 DIAGNOSIS — M5431 Sciatica, right side: Secondary | ICD-10-CM | POA: Diagnosis not present

## 2021-12-19 DIAGNOSIS — G8929 Other chronic pain: Secondary | ICD-10-CM | POA: Diagnosis not present

## 2021-12-19 DIAGNOSIS — M5441 Lumbago with sciatica, right side: Secondary | ICD-10-CM

## 2021-12-19 MED ORDER — DICLOFENAC SODIUM 1 % EX GEL: 4.0000 g | Freq: Four times a day (QID) | CUTANEOUS | 0 refills | Status: DC

## 2021-12-19 MED ORDER — MELOXICAM 15 MG PO TABS: 15.0000 mg | ORAL_TABLET | Freq: Every day | ORAL | 0 refills | Status: DC

## 2021-12-19 MED ORDER — GABAPENTIN 300 MG PO CAPS: 300.0000 mg | ORAL_CAPSULE | Freq: Every day | ORAL | 0 refills | Status: DC

## 2021-12-19 NOTE — Patient Instructions (Signed)
It was great seeing you today!  I am sorry your back is been bothering you so much.  Today we increased your gabapentin and meloxicam, and I also prescribed a topical gel.  Continue your physical therapy and exercises, and you can alternate between ice and heat as well. I Referred you to orthopedic surgery and they can discuss injection and other options.  You should hear from them in the next couple of weeks to schedule an appointment but if you do not hear back let us know at the clinic and we can check on the referral.  Your blood pressure was elevated today and I would like you to check a couple measurements at home in the morning while sitting flat in a chair for at least 5 minutes before checking it.  Please do this for couple of days and bring it to your PCP, as you may need to be started on medication for your blood pressure.    Please check-out at the front desk before leaving the clinic. Schedule to see your PCP in about 2 weeks, but if you need to be seen earlier than that for any new issues we're happy to fit you in, just give [place] a call!  Visit Reminders: - Stop by the pharmacy to pick up your prescriptions    Feel free to call with any questions or concerns at any time, at [phone].   Take care,  Dr. Shary Key [facility]

## 2021-12-19 NOTE — Progress Notes (Addendum)
    SUBJECTIVE:   CHIEF COMPLAINT / HPI:   Patient with back pain that radiates down the right leg which he has been dealing with it for months. Has been evaluated in the clinic for this on several occasions. Denies loss of bowel or bladder function. States he cant sleep. States he has some relief from Gabapentin but still in a lot of pain.  Currently in physical therapy. Would like referral to Orthopedic surgeon . States he may be interested in injection. States he is typically a very mobile person and feels very down as this is affecting his daily life and his ability to interact with his grandchildren   PERTINENT  PMH / PSH: Reviewed   OBJECTIVE:   BP (!) 147/102   Pulse 92   Wt 212 lb 9.6 oz (96.4 kg)   SpO2 100%   BMI 29.65 kg/m    Physical exam General: well appearing, NAD Cardiovascular: RRR, no murmurs Lungs: CTAB. Normal WOB Abdomen: soft, non-distended, non-tender Skin: warm, dry. No edema  Lumbar spine: - Inspection: no gross deformity or asymmetry, swelling or ecchymosis - Palpation: No TTP over the spinous processes, paraspinal muscles, or SI joints b/l - ROM: full active ROM of the lumbar spine in flexion and extension without pain - Strength: 5/5 strength of lower extremity in L4-S1 nerve root distributions b/l; normal gait - Neuro: sensation intact in the L4-S1 nerve root distribution b/l, 2+ L4 and S1 reflexes - Special testing: Negative straight leg raise, negative slump, negative Stork test, Negative FABER, Negative Gaenselen's, Negative Waddell's signs   ASSESSMENT/PLAN:   LOW BACK PAIN, CHRONIC Patient presents with continued low back pain despite undergoing PT, taking gabapentin 100 mg a day, meloxicam 7.5 mg a day.  We will increase gabapentin to 300 mg, and meloxicam to 15 mg also prescribed Voltaren gel to use up to 4 times a day as needed.  Sent referral to orthopedics for further evaluation   [name]

## 2021-12-20 ENCOUNTER — Ambulatory Visit: Payer: Medicaid Other

## 2021-12-20 DIAGNOSIS — M5459 Other low back pain: Secondary | ICD-10-CM | POA: Diagnosis not present

## 2021-12-20 DIAGNOSIS — M6281 Muscle weakness (generalized): Secondary | ICD-10-CM

## 2021-12-20 DIAGNOSIS — R262 Difficulty in walking, not elsewhere classified: Secondary | ICD-10-CM

## 2021-12-20 DIAGNOSIS — R252 Cramp and spasm: Secondary | ICD-10-CM

## 2021-12-20 DIAGNOSIS — M25551 Pain in right hip: Secondary | ICD-10-CM

## 2021-12-20 DIAGNOSIS — R293 Abnormal posture: Secondary | ICD-10-CM

## 2021-12-20 NOTE — Assessment & Plan Note (Signed)
Patient presents with continued low back pain despite undergoing PT, taking gabapentin 100 mg a day, meloxicam 7.5 mg a day.  We will increase gabapentin to 300 mg, and meloxicam to 15 mg also prescribed Voltaren gel to use up to 4 times a day as needed.  Sent referral to orthopedics for further evaluation
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
## 2021-12-25 ENCOUNTER — Ambulatory Visit: Payer: Medicaid Other

## 2021-12-26 ENCOUNTER — Ambulatory Visit (INDEPENDENT_AMBULATORY_CARE_PROVIDER_SITE_OTHER): Payer: Medicaid Other | Admitting: Surgery

## 2021-12-26 DIAGNOSIS — M5416 Radiculopathy, lumbar region: Secondary | ICD-10-CM | POA: Diagnosis not present
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
## 2021-12-31 ENCOUNTER — Ambulatory Visit: Payer: Medicaid Other | Attending: Family Medicine

## 2021-12-31 DIAGNOSIS — R252 Cramp and spasm: Secondary | ICD-10-CM | POA: Insufficient documentation

## 2021-12-31 DIAGNOSIS — M79651 Pain in right thigh: Secondary | ICD-10-CM | POA: Insufficient documentation

## 2021-12-31 DIAGNOSIS — R262 Difficulty in walking, not elsewhere classified: Secondary | ICD-10-CM | POA: Insufficient documentation

## 2021-12-31 DIAGNOSIS — M25551 Pain in right hip: Secondary | ICD-10-CM | POA: Diagnosis present

## 2021-12-31 DIAGNOSIS — M6281 Muscle weakness (generalized): Secondary | ICD-10-CM | POA: Insufficient documentation

## 2021-12-31 DIAGNOSIS — R293 Abnormal posture: Secondary | ICD-10-CM | POA: Diagnosis present

## 2021-12-31 DIAGNOSIS — M5459 Other low back pain: Secondary | ICD-10-CM | POA: Insufficient documentation
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
## 2022-01-01 ENCOUNTER — Encounter: Payer: Medicaid Other | Admitting: Gastroenterology

## 2022-01-01 ENCOUNTER — Telehealth: Payer: Self-pay | Admitting: Gastroenterology

## 2022-01-01 NOTE — Telephone Encounter (Signed)
Good Morning Dr. Candis Schatz,  I tried to call patient today at 8:45 to see if he was coming for his procedure, however I could Leave a msg there was no voicemail.  I will NO SHOW him-Medicaid

## 2022-01-03 ENCOUNTER — Ambulatory Visit: Payer: Medicaid Other

## 2022-01-03 ENCOUNTER — Telehealth: Payer: Self-pay

## 2022-01-03 NOTE — Telephone Encounter (Signed)
Unable to reach pt or leave a message on the provided phone number. Called to discuss the pt's first no show.

## 2022-01-07 ENCOUNTER — Ambulatory Visit: Payer: Medicaid Other

## 2022-01-09 ENCOUNTER — Ambulatory Visit: Payer: Medicaid Other | Admitting: Physical Therapy

## 2022-01-09 ENCOUNTER — Encounter: Payer: Self-pay | Admitting: Family Medicine

## 2022-01-09 ENCOUNTER — Telehealth: Payer: Self-pay | Admitting: Physical Therapy

## 2022-01-09 ENCOUNTER — Ambulatory Visit (INDEPENDENT_AMBULATORY_CARE_PROVIDER_SITE_OTHER): Payer: Medicaid Other | Admitting: Family Medicine

## 2022-01-09 VITALS — BP 138/88 | HR 90 | Ht 71.0 in | Wt 210.2 lb

## 2022-01-09 DIAGNOSIS — Z550 Illiteracy and low-level literacy: Secondary | ICD-10-CM

## 2022-01-09 DIAGNOSIS — M5431 Sciatica, right side: Secondary | ICD-10-CM

## 2022-01-09 MED ORDER — KETOROLAC TROMETHAMINE 30 MG/ML IJ SOLN
30.0000 mg | Freq: Once | INTRAMUSCULAR | Status: AC
  Administered 2022-01-09: 30 mg via INTRAMUSCULAR

## 2022-01-09 MED ORDER — HYDROCODONE-ACETAMINOPHEN 5-325 MG PO TABS: 1.0000 | ORAL_TABLET | Freq: Four times a day (QID) | ORAL | 0 refills | Status: AC | PRN

## 2022-01-09 MED ORDER — PREDNISONE 20 MG PO TABS: 40.0000 mg | ORAL_TABLET | Freq: Every day | ORAL | 0 refills | Status: AC

## 2022-01-09 NOTE — Patient Instructions (Signed)
Sciatica  Sciatica is pain, weakness, tingling, or loss of feeling (numbness) along the sciatic nerve. The sciatic nerve starts in the lower back and goes down the back of each leg. Sciatica usually affects one side of the body. Sciatica usually goes away on its own or with treatment. Sometimes, sciatica may come back. What are the causes? This condition happens when the sciatic nerve is pinched or has pressure put on it. This may be caused by: A disk in between the bones of the spine bulging out too far (herniated disk). Changes in the spinal disks due to aging. A condition that affects a muscle in the butt. Extra bone growth near the sciatic nerve. A break (fracture) of the area between your hip bones (pelvis). Pregnancy. Tumor. This is rare. What increases the risk? You are more likely to develop this condition if you: Play sports that put pressure or stress on the spine. Have poor strength and ease of movement (flexibility). Have had a back injury or back surgery. Sit for long periods of time. Do activities that involve bending or lifting over and over again. Are very overweight (obese). What are the signs or symptoms? Symptoms can vary from mild to very bad. They may include: Any of these problems in the lower back, leg, hip, or butt: Mild tingling, loss of feeling, or dull aches. A burning feeling. Sharp pains. Loss of feeling in the back of the calf or the sole of the foot. Leg weakness. Very bad back pain that makes it hard to move. These symptoms may get worse when you cough, sneeze, or laugh. They may also get worse when you sit or stand for long periods of time. How is this treated? This condition often gets better without any treatment. However, treatment may include: Changing or cutting back on physical activity when you have pain. Exercising, including strengthening and stretching. Putting ice or heat on the affected area. Shots of medicines to relieve pain and  swelling or to relax your muscles. Surgery. Follow these instructions at home: Medicines Take over-the-counter and prescription medicines only as told by your doctor. Ask your doctor if you should avoid driving or using machines while you are taking your medicine. Managing pain     If told, put ice on the affected area. To do this: Put ice in a plastic bag. Place a towel between your skin and the bag. Leave the ice on for 20 minutes, 2-3 times a day. If your skin turns bright red, take off the ice right away to prevent skin damage. The risk of skin damage is higher if you cannot feel pain, heat, or cold. If told, put heat on the affected area. Do this as often as told by your doctor. Use the heat source that your doctor tells you to use, such as a moist heat pack or a heating pad. Place a towel between your skin and the heat source. Leave the heat on for 20-30 minutes. If your skin turns bright red, take off the heat right away to prevent burns. The risk of burns is higher if you cannot feel pain, heat, or cold. Activity  Return to your normal activities when your doctor says that it is safe. Avoid activities that make your symptoms worse. Take short rests during the day. When you rest for a long time, do some physical activity or stretching between periods of rest. Avoid sitting for a long time without moving. Get up and move around at least one time each   hour. Do exercises and stretches as told by your doctor. Do not lift anything that is heavier than 10 lb (4.5 kg). Avoid lifting heavy things even when you do not have symptoms. Avoid lifting heavy things over and over. When you lift objects, always lift in a way that is safe for your body. To do this, you should: Bend your knees. Keep the object close to your body. Avoid twisting. General instructions Stay at a healthy weight. Wear comfortable shoes that support your feet. Avoid wearing high heels. Avoid sleeping on a mattress  that is too soft or too hard. You might have less pain if you sleep on a mattress that is firm enough to support your back. Contact a doctor if: Your pain is not controlled by medicine. Your pain does not get better. Your pain gets worse. Your pain lasts longer than 4 weeks. You lose weight without trying. Get help right away if: You cannot control when you pee (urinate) or poop (have a bowel movement). You have weakness in any of these areas and it gets worse: Lower back. The area between your hip bones. Butt. Legs. You have redness or swelling of your back. You have a burning feeling when you pee. Summary Sciatica is pain, weakness, tingling, or loss of feeling (numbness) along the sciatic nerve. This may include the lower back, legs, hips, and butt. This condition happens when the sciatic nerve is pinched or has pressure put on it. Treatment often includes rest, exercise, medicines, and putting ice or heat on the affected area. This information is not intended to replace advice given to you by your health care provider. Make sure you discuss any questions you have with your health care provider. Document Revised: 08/26/2021 Document Reviewed: 08/26/2021 Elsevier Patient Education  2023 Elsevier Inc.  

## 2022-01-09 NOTE — Progress Notes (Addendum)
DECKER COGDELL is alone Sources of clinical information for visit is/are patient and Review of 3 prior [facility] visits for right back pain with radiation since 10/14/21 and [place] initial consult note from 12/26/21 . LS spine Xray reports from 11/22/21.  Nursing assessment for this office visit was reviewed with the patient for accuracy and revision.       12/19/2021   10:41 AM  Depression screen PHQ 2/9  Decreased Interest 0  Down, Depressed, Hopeless 2  PHQ - 2 Score 2  Altered sleeping 3  Tired, decreased energy 3  Change in appetite 3  Feeling bad or failure about yourself  1  Trouble concentrating 3  Moving slowly or fidgety/restless 1  Suicidal thoughts 0  PHQ-9 Score 16   Flowsheet Row Office Visit from 12/19/2021 in [place] Office Visit from 11/22/2021 in [place] Office Visit from 10/14/2021 in [place]  Thoughts that you would be better off dead, or of hurting yourself in some way Not at all Not at all Not at all  PHQ-9 Total Score '16 12 9          '$ 11/22/2021    9:07 AM 01/18/2020    9:38 AM 12/08/2019   10:32 AM 06/15/2019    9:45 AM 04/28/2018    4:01 PM  Fall Risk   Falls in the past year? 0 0 0 0 0  Number falls in past yr: 0 0 0 0   Injury with Fall? 0 0     Follow up   Falls evaluation completed         12/19/2021   10:41 AM 11/22/2021    9:07 AM 10/14/2021    9:17 AM  PHQ9 SCORE ONLY  PHQ-9 Total Score '16 12 9    '$ Adult vaccines due  Topic Date Due   TETANUS/TDAP  08/24/2030    Health Maintenance Due  Topic Date Due   COVID-19 Vaccine (1) Never done   INFLUENZA VACCINE  12/31/2021      History/P.E. limitations: limited healthcare literacy, and illiteracy  Adult vaccines due  Topic Date Due   TETANUS/TDAP  08/24/2030   There are no preventive care reminders to display for this patient.  Health Maintenance Due  Topic Date Due   COVID-19 Vaccine (1) Never done    INFLUENZA VACCINE  12/31/2021     Chief Complaint  Patient presents with   Back Pain  Patient presenting to scheduled follow up app't made at 12/27/21 office visit   --------------------------------------------------------------------------------------------------------------------------------------------- Visit Problem List with A/P  Sciatica of right side  Right paraspinal back pain that radiates thru buttock, posterior thigh, anterior foreleg, foort dorsum.  Progressively worsening since last evaluation [facility] 10/14/21 office visit   S-S spine XR at that time showed T12-L1, L2-3, L4-5 and L5-S1 degenerative disc disease. Patient referred to orthopedists.  Seen by Benjiman Core, PA, of [place] on 12/26/21  Pain is interfering with his sleep and making his ADL/iADLs completion more effortful.       He is in OP physical therapy      Patient with with sitting, driving, going from sitting to standing.   Relief with hips flexed 90 degrees.       Little response to Flexeril, OTC NSAID and Meloxicam, APAP, Lidocaine patches       Physical eXam Back Exam: Inspection: loss lumbar lordosis Motion: SLR seated: negative  XSLR seated:   negative                      Palpable tenderness: Right lower lumbar back Sensory change: right Intact L4,L5/S1 Reflex change:  Knee 2+ bilat, ankle 0 bilaterally  Strength at foot Plantar-flexion:5  / 5    Dorsi-flexion: 5 / 5     Leg strength Quad: 5 / 5   Hamstring: 5 / 5   Hip flexor: 5 / 5    Gait: nonatalgic         While patient says he had an MRI performed, I cannot find record of one in EMR.  Does he mean LS-spine Xray?  Assessment and Plan  Chronic low back pain with right radiculopathy - Awaiting results of MRI by [facility] - Pulsee dose prednisone 40 mg x 5 days - Prescription Hydrocodone-APAP 5-325 #30 tab. RF zero.  - ROI imaging results that may be available from orth[facility] care not in EMR.           30 minutes in chart review, contacting orthopedic offices for MRI report that patient says he had done, talking with Dr Lorin Mercy (ortho), and documenting visit.

## 2022-01-09 NOTE — Telephone Encounter (Signed)
Called to check on pt after second NS.  No answer.  Will cancel remaining appts, if pt calls back he can schedule 1 visit at a time per attendance policy.

## 2022-01-09 NOTE — Assessment & Plan Note (Addendum)
  Right paraspinal back pain that radiates thru buttock, posterior thigh, anterior foreleg, foort dorsum.  Progressively worsening since last evaluation [facility] 10/14/21 office visit   S-S spine XR at that time showed T12-L1, L2-3, L4-5 and L5-S1 degenerative disc disease. Patient referred to orthopedists.  Seen by Benjiman Core, PA, of [place] on 12/26/21  Pain is interfering with his sleep and making his ADL/iADLs completion more effortful.       He is in OP physical therapy      Patient with with sitting, driving, going from sitting to standing.   Relief with hips flexed 90 degrees.       Little response to Flexeril, OTC NSAID and Meloxicam, APAP, Lidocaine patches       Physical eXam Back Exam: Inspection: loss lumbar lordosis Motion: SLR seated: negative                       XSLR seated:   negative                      Palpable tenderness: Right lower lumbar back Sensory change: right Intact L4,L5/S1 Reflex change:  Knee 2+ bilat, ankle 0 bilaterally  Strength at foot Plantar-flexion:5  / 5    Dorsi-flexion: 5 / 5     Leg strength Quad: 5 / 5   Hamstring: 5 / 5   Hip flexor: 5 / 5    Gait: nonatalgic         While patient says he had an MRI performed, I cannot find record of one in EMR.  Does he mean LS-spine Xray?  Assessment and Plan  Chronic low back pain with right radiculopathy - Awaiting results of MRI by [facility] - Pulsee dose prednisone 40 mg x 5 days - Prescription Hydrocodone-APAP 5-325 #30 tab. RF zero.  - ROI imaging results that may be available from ortho care not in EMR.

## 2022-01-14 ENCOUNTER — Ambulatory Visit: Payer: Medicaid Other

## 2022-02-04 ENCOUNTER — Ambulatory Visit (INDEPENDENT_AMBULATORY_CARE_PROVIDER_SITE_OTHER): Payer: Medicaid Other | Admitting: Orthopaedic Surgery

## 2022-02-04 ENCOUNTER — Encounter: Payer: Self-pay | Admitting: Orthopaedic Surgery

## 2022-02-04 DIAGNOSIS — M5116 Intervertebral disc disorders with radiculopathy, lumbar region: Secondary | ICD-10-CM | POA: Diagnosis not present
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
## 2022-02-20 ENCOUNTER — Ambulatory Visit (INDEPENDENT_AMBULATORY_CARE_PROVIDER_SITE_OTHER): Payer: Medicaid Other | Admitting: Physical Medicine and Rehabilitation

## 2022-02-20 ENCOUNTER — Ambulatory Visit: Payer: Self-pay

## 2022-02-20 VITALS — BP 150/98 | HR 97

## 2022-02-20 DIAGNOSIS — M5416 Radiculopathy, lumbar region: Secondary | ICD-10-CM

## 2022-02-20 MED ORDER — METHYLPREDNISOLONE ACETATE 80 MG/ML IJ SUSP
40.0000 mg | Freq: Once | INTRAMUSCULAR | Status: AC
  Administered 2022-02-20: 40 mg

## 2022-02-20 NOTE — Patient Instructions (Signed)

## 2022-02-20 NOTE — Progress Notes (Signed)
Low back pain radiating down right side No blood thinners +driver

## 2022-02-21 NOTE — Progress Notes (Signed)
Derrick Ortiz - 51 y.o. male MRN [id number]  Date of birth: 04-03-1971  Office Visit Note: Visit Date: 02/20/2022 PCP: McDiarmid, Blane Ohara, MD Referred by: McDiarmid, Blane Ohara, MD  Subjective: Chief Complaint  Patient presents with   Lower Back - Pain   HPI:  Derrick Ortiz is a 51 y.o. male who comes in today at the request of Dr. Rodell Perna for planned Right L5-S1 Lumbar Interlaminar epidural steroid injection with fluoroscopic guidance.  The patient has failed conservative care including home exercise, medications, time and activity modification.  This injection will be diagnostic and hopefully therapeutic.  Please see requesting physician notes for further details and justification.   ROS Otherwise per HPI.  Assessment & Plan: Visit Diagnoses:    ICD-10-CM   1. Lumbar radiculopathy  M54.16 XR C-ARM NO REPORT    Epidural Steroid injection    methylPREDNISolone acetate (DEPO-MEDROL) injection 40 mg      Plan: No additional findings.   Meds & Orders:  Meds ordered this encounter  Medications   methylPREDNISolone acetate (DEPO-MEDROL) injection 40 mg    Orders Placed This Encounter  Procedures   XR C-ARM NO REPORT   Epidural Steroid injection    Follow-up: Return if symptoms worsen or fail to improve.   Procedures: No procedures performed  Lumbar Epidural Steroid Injection - Interlaminar Approach with Fluoroscopic Guidance  Patient: Derrick Ortiz      Date of Birth: 06-16-1970 MRN: [id number] PCP: McDiarmid, Blane Ohara, MD      Visit Date: 02/20/2022   Universal Protocol:     Consent Given By: the patient  Position: PRONE  Additional Comments: Vital signs were monitored before and after the procedure. Patient was prepped and draped in the usual sterile fashion. The correct patient, procedure, and site was verified.   Injection Procedure Details:   Procedure diagnoses: Lumbar radiculopathy [M54.16]   Meds Administered:  Meds ordered this encounter   Medications   methylPREDNISolone acetate (DEPO-MEDROL) injection 40 mg     Laterality: Right  Location/Site:  L4-5  Needle: 3.5 in., 20 ga. Tuohy  Needle Placement: Paramedian epidural  Findings:   -Comments: Excellent flow of contrast into the epidural space.  Procedure Details: Using a paramedian approach from the side mentioned above, the region overlying the inferior lamina was localized under fluoroscopic visualization and the soft tissues overlying this structure were infiltrated with 4 ml. of 1% Lidocaine without Epinephrine. The Tuohy needle was inserted into the epidural space using a paramedian approach.   The epidural space was localized using loss of resistance along with counter oblique bi-planar fluoroscopic views.  After negative aspirate for air, blood, and CSF, a 2 ml. volume of Isovue-250 was injected into the epidural space and the flow of contrast was observed. Radiographs were obtained for documentation purposes.    The injectate was administered into the level noted above.   Additional Comments:  The patient tolerated the procedure well Dressing: 2 x 2 sterile gauze and Band-Aid    Post-procedure details: Patient was observed during the procedure. Post-procedure instructions were reviewed.  Patient left the clinic in stable condition.   Clinical History: No specialty comments available.     Objective:  VS:  HT:    WT:   BMI:     BP:(!) 150/98  HR:97bpm  TEMP: ( )  RESP:  Physical Exam Vitals and nursing note reviewed.  Constitutional:      General: He is not in acute distress.  Appearance: Normal appearance. He is not ill-appearing.  HENT:     Head: Normocephalic and atraumatic.     Right Ear: External ear normal.     Left Ear: External ear normal.     Nose: No congestion.  Eyes:     Extraocular Movements: Extraocular movements intact.  Cardiovascular:     Rate and Rhythm: Normal rate.     Pulses: Normal pulses.  Pulmonary:      Effort: Pulmonary effort is normal. No respiratory distress.  Abdominal:     General: There is no distension.     Palpations: Abdomen is soft.  Musculoskeletal:        General: No tenderness or signs of injury.     Cervical back: Neck supple.     Right lower leg: No edema.     Left lower leg: No edema.     Comments: Patient has good distal strength without clonus.  Skin:    Findings: No erythema or rash.  Neurological:     General: No focal deficit present.     Mental Status: He is alert and oriented to person, place, and time.     Sensory: No sensory deficit.     Motor: No weakness or abnormal muscle tone.     Coordination: Coordination normal.  Psychiatric:        Mood and Affect: Mood normal.        Behavior: Behavior normal.      Imaging: XR C-ARM NO REPORT  Result Date: 02/20/2022 Please see Notes tab for imaging impression.

## 2022-02-21 NOTE — Procedures (Signed)
Lumbar Epidural Steroid Injection - Interlaminar Approach with Fluoroscopic Guidance  Patient: Derrick Ortiz      Date of Birth: May 02, 1971 MRN: [id number] PCP: McDiarmid, Blane Ohara, MD      Visit Date: 02/20/2022   Universal Protocol:     Consent Given By: the patient  Position: PRONE  Additional Comments: Vital signs were monitored before and after the procedure. Patient was prepped and draped in the usual sterile fashion. The correct patient, procedure, and site was verified.   Injection Procedure Details:   Procedure diagnoses: Lumbar radiculopathy [M54.16]   Meds Administered:  Meds ordered this encounter  Medications   methylPREDNISolone acetate (DEPO-MEDROL) injection 40 mg     Laterality: Right  Location/Site:  L4-5  Needle: 3.5 in., 20 ga. Tuohy  Needle Placement: Paramedian epidural  Findings:   -Comments: Excellent flow of contrast into the epidural space.  Procedure Details: Using a paramedian approach from the side mentioned above, the region overlying the inferior lamina was localized under fluoroscopic visualization and the soft tissues overlying this structure were infiltrated with 4 ml. of 1% Lidocaine without Epinephrine. The Tuohy needle was inserted into the epidural space using a paramedian approach.   The epidural space was localized using loss of resistance along with counter oblique bi-planar fluoroscopic views.  After negative aspirate for air, blood, and CSF, a 2 ml. volume of Isovue-250 was injected into the epidural space and the flow of contrast was observed. Radiographs were obtained for documentation purposes.    The injectate was administered into the level noted above.   Additional Comments:  The patient tolerated the procedure well Dressing: 2 x 2 sterile gauze and Band-Aid    Post-procedure details: Patient was observed during the procedure. Post-procedure instructions were reviewed.  Patient left the clinic in stable  condition.

## 2022-06-17 ENCOUNTER — Ambulatory Visit (INDEPENDENT_AMBULATORY_CARE_PROVIDER_SITE_OTHER): Payer: Medicaid Other | Admitting: Student

## 2022-06-17 ENCOUNTER — Encounter: Payer: Self-pay | Admitting: Student

## 2022-06-17 VITALS — BP 132/80 | HR 83 | Temp 97.6°F | Ht 71.0 in | Wt 210.8 lb

## 2022-06-17 DIAGNOSIS — H60503 Unspecified acute noninfective otitis externa, bilateral: Secondary | ICD-10-CM | POA: Diagnosis present

## 2022-06-17 MED ORDER — CIPRO HC 0.2-1 % OT SUSP: 3.0000 [drp] | Freq: Two times a day (BID) | OTIC | 0 refills | Status: AC

## 2022-06-17 NOTE — Progress Notes (Signed)
    SUBJECTIVE:   CHIEF COMPLAINT / HPI:   Blood in left ear First noticed blood 3 days ago, used wash cloth to wipe ear and saw blood. Used Qtip to check again. Reports ear is a little sore. No difficulty hearing.  Not on blood thinners.  No previous episodes. Feeling a little weak and had upset stomach this morning. No sick contacts. Smoking 5 cigarettes a day. Denies any head trauma.  PERTINENT  PMH / PSH:   OBJECTIVE:   BP 132/80   Pulse 83   Temp 97.6 F (36.4 C)   Ht '5\' 11"'$  (1.803 m)   Wt 210 lb 12.8 oz (95.6 kg)   SpO2 97%   BMI 29.40 kg/m    General: NAD, well-appearing HEENT: Normocephalic, atraumatic head.  Normal external ear.  Bilateral inferior wall abrasions to external auditory canal, with dried hemostatic blood.  Edematous and erythematous canals bilaterally.  No cerumen or debris.  Bilateral TMs intact, not bulging, normal translucency, normal cone of light. Cardio: RRR, no MRG Respiratory: CTAB, normal breathing on room air  ASSESSMENT/PLAN:   Acute otitis externa of both ears Based on exam (see above) likely otitis externa with mechanical abrasion to ear canals.  TM intact bilaterally.  Differential includes: Otomycosis, chronic suppurative otitis media, carcinoma of the ear canal and psoriasis.  Will treat with antibiotic course.  If patient does not improve with otitis externa treatment, consider differentials as listed. -Ciprofloxacin-hydrocortisone otic suspension, twice daily for 7 days -Follow-up if not resolved or worsens in 14 days   Leslie Dales, [place]

## 2022-06-17 NOTE — Assessment & Plan Note (Signed)
Based on exam (see above) likely otitis externa with mechanical abrasion to ear canals.  TM intact bilaterally.  Differential includes: Otomycosis, chronic suppurative otitis media, carcinoma of the ear canal and psoriasis.  Will treat with antibiotic course.  If patient does not improve with otitis externa treatment, consider differentials as listed. -Ciprofloxacin-hydrocortisone otic suspension, twice daily for 7 days -Follow-up if not resolved or worsens in 14 days

## 2022-06-17 NOTE — Patient Instructions (Signed)
It was great to see you! Thank you for allowing me to participate in your care!   I recommend that you always bring your medications to each appointment as this makes it easy to ensure we are on the correct medications and helps [place] not miss when refills are needed.  Our plans for today:  -Please place 3 drops in both ears 2 times daily (morning and night) for 7 days -If your symptoms are improved in 2 weeks, please make appointment to be seen -Please do not place anything in your ear canal  Take care and seek immediate care sooner if you develop any concerns. Please remember to show up 15 minutes before your scheduled appointment time!  Leslie Dales, DO [facility]

## 2022-06-20 ENCOUNTER — Other Ambulatory Visit (HOSPITAL_COMMUNITY): Payer: Self-pay

## 2022-06-26 ENCOUNTER — Other Ambulatory Visit (HOSPITAL_COMMUNITY): Payer: Self-pay

## 2022-06-26 ENCOUNTER — Telehealth: Payer: Self-pay

## 2022-06-26 NOTE — Telephone Encounter (Signed)
Rec'd PA request for patients medication [facility] OTIC SUSPENSION.  Medicaid prefers:   Would you like to send in a preferred medication or have me attempt the prior auth?

## 2022-06-30 MED ORDER — CIPROFLOXACIN-DEXAMETHASONE 0.3-0.1 % OT SUSP: 4.0000 [drp] | Freq: Two times a day (BID) | OTIC | 0 refills | Status: AC

## 2022-06-30 NOTE — Addendum Note (Signed)
Addended by: Arlyce Dice on: 06/30/2022 08:30 PM   Modules accepted: Orders

## 2022-12-17 ENCOUNTER — Other Ambulatory Visit: Payer: Self-pay

## 2022-12-17 ENCOUNTER — Ambulatory Visit: Payer: MEDICAID | Admitting: Family Medicine

## 2022-12-17 ENCOUNTER — Encounter: Payer: Self-pay | Admitting: Family Medicine

## 2022-12-17 ENCOUNTER — Other Ambulatory Visit (HOSPITAL_COMMUNITY)
Admission: RE | Admit: 2022-12-17 | Discharge: 2022-12-17 | Disposition: A | Discharge status: Home / Self Care | Payer: MEDICAID | Source: Ambulatory Visit | Attending: Family Medicine | Admitting: Family Medicine

## 2022-12-17 VITALS — BP 134/93 | HR 92 | Ht 72.0 in | Wt 210.0 lb

## 2022-12-17 DIAGNOSIS — Z113 Encounter for screening for infections with a predominantly sexual mode of transmission: Secondary | ICD-10-CM | POA: Diagnosis not present

## 2022-12-17 DIAGNOSIS — R300 Dysuria: Secondary | ICD-10-CM | POA: Insufficient documentation

## 2022-12-17 LAB — POCT URINALYSIS DIP (MANUAL ENTRY)
Glucose, UA: NEGATIVE mg/dL
Ketones, POC UA: NEGATIVE mg/dL
Leukocytes, UA: NEGATIVE
Nitrite, UA: NEGATIVE
Protein Ur, POC: 100 mg/dL — AB
Spec Grav, UA: >=1.03 — AB (ref 1.010–1.025)
Urobilinogen, UA: 0.2 E.U./dL
pH, UA: 5.5 (ref 5.0–8.0)

## 2022-12-17 NOTE — Progress Notes (Signed)
   SUBJECTIVE:   CHIEF COMPLAINT / HPI:  Vaginal Discharge: Derrick Ortiz is a 52 y.o. male presenting with burning with urination for 2-3 weeks. Endorses burning with urination intermittently. No discharge. No genital lesions, no rashes. No itching. Is interested in screening for sexually transmitted infections today, but patient declines HIV testing. does use barrier method consistently, but condom came off recently during intercourse.  PERTINENT PMH / PSH: Is sexually active with a single male for past month, has had another partner the past few months.   OBJECTIVE:  BP (!) 134/93   Pulse 92   Ht 6' (1.829 m)   Wt 210 lb (95.3 kg)   SpO2 100%   BMI 28.48 kg/m   General: NAD, pleasant, able to participate in exam. Respiratory: Normal effort, no obvious respiratory distress. Abdominal: No suprapubic tenderness, no abdominal tenderness with deep or light palpation.  No hepatosplenomegaly. Genital: Deferred, no indication for exam.  POCT Labs Results for orders placed or performed in visit on 12/17/22 (from the past 24 hour(s))  POCT urinalysis dipstick     Status: Abnormal   Collection Time: 12/17/22  9:45 AM  Result Value Ref Range   Color, UA yellow yellow   Clarity, UA clear clear   Glucose, UA negative negative mg/dL   Bilirubin, UA small (A) negative   Ketones, POC UA negative negative mg/dL   Spec Grav, UA >=4.098 (A) 1.010 - 1.025   Blood, UA small (A) negative   pH, UA 5.5 5.0 - 8.0   Protein Ur, POC =100 (A) negative mg/dL   Urobilinogen, UA 0.2 0.2 or 1.0 E.U./dL   Nitrite, UA Negative Negative   Leukocytes, UA Negative Negative   Narrative   QNS - Quantity Not Sufficient for microscopic exam    ASSESSMENT/PLAN:   Problem List Items Addressed This Visit       Other   Dysuria - Primary    52 y.o. male with dysuria, concerning for STI.  Low concern for UTI and urine dipstick with negative leukocytes/nitrates. Urine G/C/trichomonas and RPR  ordered, patient declines HIV after counseling; will treat if positive Discussed protection during intercourse and contraceptive methods Urine dipstick positive for small blood, protein, bilirubin; suspect dehydration, but will follow up with patient to schedule return visit      Relevant Orders   POCT urinalysis dipstick (Completed)   Urine cytology ancillary only   Other Visit Diagnoses     Screening examination for STI       Relevant Orders   RPR      Follow up as needed.  Pairlee Sawtell Sharion Dove, MD [facility]

## 2022-12-17 NOTE — Assessment & Plan Note (Addendum)
52 y.o. male with dysuria, concerning for STI.  Low concern for UTI and urine dipstick with negative leukocytes/nitrates. Urine G/C/trichomonas and RPR ordered, patient declines HIV after counseling; will treat if positive Discussed protection during intercourse and contraceptive methods Urine dipstick positive for small blood, protein, bilirubin; suspect dehydration, but will follow up with patient to schedule return visit

## 2022-12-17 NOTE — Patient Instructions (Addendum)
It was great to see you today! Thank you for choosing [facility] for your primary care.  Today we addressed: Burning with urination: We are testing you for gonorrhea, chlamydia, trichomonas, and syphilis.  I will give you a call if anything is positive!  Thank you for coming to see [place] at [facility] and for the opportunity to care for you!  Bradie Lacock, MD 12/17/2022, 9:52 AM  ____________________________________________________  Make sure to check out at the front desk before you leave today.  Please arrive at least 15 minutes prior to your scheduled appointments.  If you haven't already, please set up MyChart to have easy access to your labs results, and communication with your primary care physician.  If you had blood work today, you will get a MyChart message or a letter if results are normal. Otherwise, you will get a call from [place].  If you had a referral placed, they will call you to set up an appointment. Please give [place] a call if you don't hear back in the next 2 weeks.  If you need additional refills before your next appointment, please call your pharmacy first.

## 2022-12-18 LAB — RPR: RPR Ser Ql: NONREACTIVE

## 2022-12-19 LAB — URINE CYTOLOGY ANCILLARY ONLY
Chlamydia: NEGATIVE
Comment: NEGATIVE
Comment: NEGATIVE
Comment: NORMAL
Neisseria Gonorrhea: NEGATIVE
Trichomonas: NEGATIVE

## 2022-12-22 ENCOUNTER — Telehealth: Payer: Self-pay | Admitting: Student

## 2022-12-22 NOTE — Telephone Encounter (Signed)
Called patient to go over results of STI screening - negative. Patient appreciative of results.   Glendale Chard, DO [facility], PGY-2 12/22/22 8:28 AM

## 2022-12-25 ENCOUNTER — Ambulatory Visit: Payer: MEDICAID | Admitting: Family Medicine

## 2023-01-08 ENCOUNTER — Ambulatory Visit: Payer: MEDICAID | Admitting: Family Medicine

## 2023-09-24 ENCOUNTER — Ambulatory Visit (INDEPENDENT_AMBULATORY_CARE_PROVIDER_SITE_OTHER): Payer: MEDICAID | Admitting: Family Medicine

## 2023-09-24 ENCOUNTER — Encounter: Payer: Self-pay | Admitting: Family Medicine

## 2023-09-24 VITALS — BP 138/90 | HR 79 | Ht 72.0 in | Wt 222.0 lb

## 2023-09-24 DIAGNOSIS — K219 Gastro-esophageal reflux disease without esophagitis: Secondary | ICD-10-CM | POA: Diagnosis not present

## 2023-09-24 DIAGNOSIS — Z00129 Encounter for routine child health examination without abnormal findings: Secondary | ICD-10-CM

## 2023-09-24 DIAGNOSIS — M5431 Sciatica, right side: Secondary | ICD-10-CM | POA: Diagnosis not present

## 2023-09-24 DIAGNOSIS — G5602 Carpal tunnel syndrome, left upper limb: Secondary | ICD-10-CM

## 2023-09-24 DIAGNOSIS — Z79899 Other long term (current) drug therapy: Secondary | ICD-10-CM | POA: Diagnosis not present

## 2023-09-24 DIAGNOSIS — E7800 Pure hypercholesterolemia, unspecified: Secondary | ICD-10-CM | POA: Diagnosis not present

## 2023-09-24 DIAGNOSIS — Z0000 Encounter for general adult medical examination without abnormal findings: Secondary | ICD-10-CM

## 2023-09-24 DIAGNOSIS — R7303 Prediabetes: Secondary | ICD-10-CM

## 2023-09-24 DIAGNOSIS — M5116 Intervertebral disc disorders with radiculopathy, lumbar region: Secondary | ICD-10-CM

## 2023-09-24 LAB — POCT GLYCOSYLATED HEMOGLOBIN (HGB A1C): Hemoglobin A1C: 6 % — AB (ref 4.0–5.6)

## 2023-09-24 MED ORDER — MELOXICAM 15 MG PO TABS: 15.0000 mg | ORAL_TABLET | Freq: Every day | ORAL | 1 refills | Status: AC

## 2023-09-24 MED ORDER — OMEPRAZOLE 20 MG PO CPDR: 20.0000 mg | DELAYED_RELEASE_CAPSULE | Freq: Every day | ORAL | 3 refills | Status: AC

## 2023-09-24 MED ORDER — PRAVASTATIN SODIUM 20 MG PO TABS: 20.0000 mg | ORAL_TABLET | Freq: Every day | ORAL | 3 refills | Status: AC

## 2023-09-24 NOTE — Progress Notes (Unsigned)
 newtonMichael R Desire is {Pc accompanied by:5710} Sources of clinical information for visit is/are {Information source:60032}. Nursing assessment for this office visit was reviewed with the patient for accuracy and revision.     Previous Report(s) Reviewed: {Outside review:15817}     01/09/2022    3:21 PM  Depression screen PHQ 2/9  Decreased Interest 0  Down, Depressed, Hopeless 0  PHQ - 2 Score 0   Flowsheet Row Office Visit from 12/19/2021 in [facility] - A Dept Of [place] Office Visit from 11/22/2021 in [facility] - A Dept Of Tom[facility] Fragmin. [facility] Office Visit from 10/14/2021 in [facility] - A Dept Of [facility]  Thoughts that you would be better off dead, or of hurting yourself in some way Not at all Not at all Not at all  PHQ-9 Total Score 16 12 9           12/17/2022    9:36 AM 11/22/2021    9:07 AM 01/18/2020    9:38 AM 12/08/2019   10:32 AM 06/15/2019    9:45 AM  Fall Risk   Falls in the past year? 0 0 0 0 0  Number falls in past yr: 0 0 0 0 0  Injury with Fall? 0 0 0    Follow up    Falls evaluation completed        01/09/2022    3:21 PM 12/19/2021   10:41 AM 11/22/2021    9:07 AM  PHQ9 SCORE ONLY  PHQ-9 Total Score 0 16 12    There are no preventive care reminders to display for this patient.  Health Maintenance Due  Topic Date Due   Pneumococcal Vaccine 3-88 Years old (1 of 2 - PCV) Never done   Zoster Vaccines- Shingrix (1 of 2) Never done      History/P.E. limitations: {exam; limitations ed:60112}  There are no preventive care reminders to display for this patient. There are no preventive care reminders to display for this patient.  Health Maintenance Due  Topic Date Due   Pneumococcal Vaccine 69-59 Years old (1 of 2 - PCV) Never done   Zoster Vaccines- Shingrix (1 of 2) Never done     Chief Complaint  Patient presents with   Annual Exam      --------------------------------------------------------------------------------------------------------------------------------------------- Visit Problem List with A/P  No problem-specific Assessment & Plan notes found for this encounter.

## 2023-09-24 NOTE — Patient Instructions (Addendum)
 A referral back to Dr Daisey Dryer for an injection in your back was made.  Starting a new medicine called pravastatin , take one tablet at bedtime. It is to lower your cholesterol and decrease the chances of having a heart attack or stroke.   If you start getting muscle aches, stop the pravastatin  and let Dr Zetta Stoneman know.   Try meloxicam  for your pain.  Take with food once a day when needed for back pain.    Wear the wrist splint at bedtime.  Also wear it on days your hand is tingling or aching.  This is for Carpal Tunnel Syndrome.   YOur A1c measure of your blood sugar shows you are in Prediabetes.  This means you are at risk of becoming diabetic.  Work on decreasing starches in your diet, like potatoes, white rice, and white breads.  Avoid Sports Drinks, [place], and [address]   Try to reduce your weight by 5 to 10 pounds in the next 3 months.

## 2023-09-25 DIAGNOSIS — G5602 Carpal tunnel syndrome, left upper limb: Secondary | ICD-10-CM | POA: Insufficient documentation

## 2023-09-25 DIAGNOSIS — R7303 Prediabetes: Secondary | ICD-10-CM | POA: Insufficient documentation

## 2023-09-25 LAB — CMP14+EGFR
ALT: 25 IU/L (ref 0–44)
AST: 24 IU/L (ref 0–40)
Albumin: 4.6 g/dL (ref 3.8–4.9)
Alkaline Phosphatase: 111 IU/L (ref 44–121)
BUN/Creatinine Ratio: 11 (ref 9–20)
BUN: 10 mg/dL (ref 6–24)
Bilirubin Total: 0.6 mg/dL (ref 0.0–1.2)
CO2: 23 mmol/L (ref 20–29)
Calcium: 10.2 mg/dL (ref 8.7–10.2)
Chloride: 104 mmol/L (ref 96–106)
Creatinine, Ser: 0.91 mg/dL (ref 0.76–1.27)
Globulin, Total: 2.7 g/dL (ref 1.5–4.5)
Glucose: 96 mg/dL (ref 70–99)
Potassium: 5.3 mmol/L — ABNORMAL HIGH (ref 3.5–5.2)
Sodium: 139 mmol/L (ref 134–144)
Total Protein: 7.3 g/dL (ref 6.0–8.5)
eGFR: 102 mL/min/{1.73_m2} (ref >59)

## 2023-09-25 LAB — LIPID PANEL
Chol/HDL Ratio: 5.3 ratio — ABNORMAL HIGH (ref 0.0–5.0)
Cholesterol, Total: 217 mg/dL — ABNORMAL HIGH (ref 100–199)
HDL: 41 mg/dL (ref >39)
LDL Chol Calc (NIH): 159 mg/dL — ABNORMAL HIGH (ref 0–99)
Triglycerides: 97 mg/dL (ref 0–149)
VLDL Cholesterol Cal: 17 mg/dL (ref 5–40)

## 2023-09-25 LAB — PSA: Prostate Specific Ag, Serum: 0.8 ng/mL (ref 0.0–4.0)

## 2023-09-25 NOTE — Assessment & Plan Note (Signed)
 Established problem Well Controlled. Patient is at goal of dyspepsia symptoms control. No signs of complications, medication side effects, or red flags. Continue omeprazole  20 mg daily

## 2023-09-25 NOTE — Assessment & Plan Note (Signed)
 New diagnosis Lab Results  Component Value Date   HGBA1C 6.0 (A) 09/24/2023   Obese BMI 30% Discussed working on diet and goal of 5% weight loss in next 6 months.  Decreasing starches in diet, especially white rice and potatoes.  He declined referral for Nutrition consultation RTC 3 months for reassess

## 2023-09-25 NOTE — Assessment & Plan Note (Signed)
 Established problem worsened.  Patient is not at goal of pain control Onset: 2-3 weeks ago Location: right low back pain  Quality: tight, aching, occasionally sharp Severity: moderate to severe Function: interfering with sleep and making usual activity during the day more taxing Pattern: intermittent, ~ 3 times a week, sometimes lasting all day Course: stable Radiation: no radiation down leg like previously HNP Relief: no relief with Ibuprofen  Precipitant: none recalled Associated Symptoms: No leg numbness or tingling  Trauma (Acute or Chronic): no  Prior Diagnostic Testing or Treatments: 02/04/2022 [facility] consultation with Dr Murrel Arnt for Lumbar Disc herniation with radiculopathy in L4-5 single level - Sent for epidural injection by Matt Song MD ([facility]) with good success.  Relevant PMH/PSH: Lumbar MRI by Max Spain not available in EPIC  PE Mild right lumbar paraspinal mus TTP Right (-) SLR Right DTR ankle and patella 1-2+ R Intact sensation light touch right foot plantar and dorsum 5/5 Flex/Ext right knee and right ankle DF/PF  Start: Meloxicam  15 mg daily prn back pain  Referral to Dr Daisey Dryer ([facility]) for consideration of injection

## 2023-09-25 NOTE — Assessment & Plan Note (Signed)
 Declined Prevnar today - wants to wait until next office visit PSA collected today

## 2023-09-25 NOTE — Assessment & Plan Note (Addendum)
 New problem Onset: last week Location: left thumb tingling and pain Severity: mild to moderate Function: not interfering with use of hand Pattern: intermittent, worse at night, (+) Flick test Course: stable  Radiation: up forearm occasionally Relief: no relief with ibuprofen  Precipitant: none recalled Associated Symptoms:       Restricted ROM/stiffness/swelling:  no       Muscle ache/cramp/spasms: no       Color or temperature change: no       Muscle strength change: no       Change in sensation (dysesthesia/itch or numbness): tingling Functional Impact:             Change in sleep from pain: yes     Trauma (Acute or Chronic): none Prior Diagnostic Testing or Treatments: none Relevant PMH/PSH: none  PE Right wrist (+) phalen (+) tinel 5/5 grip No mus waisting  A/ Possible Carpal Tunnel syndrome, left wrist P/ Cock-up wrist splint (provided) at night then prn during day when symptoms occur.  Rest Meloxicam  15 mg daily prn  RTC if no relief or worsens

## 2023-09-25 NOTE — Assessment & Plan Note (Addendum)
 Established problem ASCVD risk 11% and aortic atherosclerosis and prediabetes Lipid Panel     Component Value Date/Time   CHOL 217 (H) 09/24/2023 1041   TRIG 97 09/24/2023 1041   HDL 41 09/24/2023 1041   CHOLHDL 5.3 (H) 09/24/2023 1041   CHOLHDL 4.6 11/01/2015 1109   VLDL 18 11/01/2015 1109   LDLCALC 159 (H) 09/24/2023 1041   LABVLDL 17 09/24/2023 1041    Start pravastatin  20 mg daily. Retest LDL on this dose. Increase to 40 mg daily if tolerates 20

## 2023-09-29 ENCOUNTER — Ambulatory Visit: Payer: MEDICAID | Admitting: Physical Medicine and Rehabilitation

## 2023-10-13 ENCOUNTER — Ambulatory Visit: Payer: MEDICAID | Admitting: Physical Medicine and Rehabilitation
